# Patient Record
Sex: Female | Born: 1980 | Race: Black or African American | Hispanic: No | Marital: Single | Smoking: Current every day smoker
Health system: Southern US, Community
[De-identification: ages and names within clinical notes are randomized; demographics above are authoritative.]

## PROBLEM LIST (undated history)

## (undated) DIAGNOSIS — I2699 Other pulmonary embolism without acute cor pulmonale: Secondary | ICD-10-CM

## (undated) DIAGNOSIS — K5190 Ulcerative colitis, unspecified, without complications: Secondary | ICD-10-CM

## (undated) DIAGNOSIS — E119 Type 2 diabetes mellitus without complications: Secondary | ICD-10-CM

## (undated) HISTORY — DX: Type 2 diabetes mellitus without complications: E11.9

---

## 2011-02-04 DIAGNOSIS — I2699 Other pulmonary embolism without acute cor pulmonale: Secondary | ICD-10-CM | POA: Insufficient documentation

## 2013-11-08 ENCOUNTER — Emergency Department: Payer: Self-pay | Admitting: Emergency Medicine

## 2013-11-08 LAB — URINALYSIS, COMPLETE
BILIRUBIN, UR: NEGATIVE
GLUCOSE, UR: NEGATIVE mg/dL (ref 0–75)
KETONE: NEGATIVE
Nitrite: NEGATIVE
PH: 6 (ref 4.5–8.0)
PROTEIN: NEGATIVE
SPECIFIC GRAVITY: 1.023 (ref 1.003–1.030)
WBC UR: 6 /HPF (ref 0–5)

## 2014-05-22 ENCOUNTER — Emergency Department: Payer: Self-pay | Admitting: Emergency Medicine

## 2014-05-22 LAB — COMPREHENSIVE METABOLIC PANEL
ALBUMIN: 3.9 g/dL (ref 3.4–5.0)
ALK PHOS: 73 U/L
Anion Gap: 10 (ref 7–16)
BILIRUBIN TOTAL: 0.2 mg/dL (ref 0.2–1.0)
BUN: 8 mg/dL (ref 7–18)
CHLORIDE: 107 mmol/L (ref 98–107)
Calcium, Total: 9.1 mg/dL (ref 8.5–10.1)
Co2: 22 mmol/L (ref 21–32)
Creatinine: 0.84 mg/dL (ref 0.60–1.30)
Glucose: 92 mg/dL (ref 65–99)
Osmolality: 276 (ref 275–301)
POTASSIUM: 3.9 mmol/L (ref 3.5–5.1)
SGOT(AST): 25 U/L (ref 15–37)
SGPT (ALT): 33 U/L
Sodium: 139 mmol/L (ref 136–145)
Total Protein: 8.6 g/dL — ABNORMAL HIGH (ref 6.4–8.2)

## 2014-05-22 LAB — CBC WITH DIFFERENTIAL/PLATELET
Basophil #: 0.1 10*3/uL (ref 0.0–0.1)
Basophil %: 0.6 %
Eosinophil #: 0.2 10*3/uL (ref 0.0–0.7)
Eosinophil %: 1.7 %
HCT: 40.4 % (ref 35.0–47.0)
HGB: 13.8 g/dL (ref 12.0–16.0)
Lymphocyte #: 3.6 10*3/uL (ref 1.0–3.6)
Lymphocyte %: 32.6 %
MCH: 30.7 pg (ref 26.0–34.0)
MCHC: 34.1 g/dL (ref 32.0–36.0)
MCV: 90 fL (ref 80–100)
Monocyte #: 0.9 x10 3/mm (ref 0.2–0.9)
Monocyte %: 8.4 %
Neutrophil #: 6.3 10*3/uL (ref 1.4–6.5)
Neutrophil %: 56.7 %
Platelet: 325 10*3/uL (ref 150–440)
RBC: 4.49 10*6/uL (ref 3.80–5.20)
RDW: 13.6 % (ref 11.5–14.5)
WBC: 11.1 10*3/uL — ABNORMAL HIGH (ref 3.6–11.0)

## 2014-05-22 LAB — URINALYSIS, COMPLETE
Bacteria: NONE SEEN
Bilirubin,UR: NEGATIVE
Glucose,UR: NEGATIVE mg/dL (ref 0–75)
Ketone: NEGATIVE
NITRITE: NEGATIVE
PROTEIN: NEGATIVE
Ph: 5 (ref 4.5–8.0)
RBC,UR: 6 /HPF (ref 0–5)
Specific Gravity: 1.017 (ref 1.003–1.030)
WBC UR: 7 /HPF (ref 0–5)

## 2014-05-22 LAB — LIPASE, BLOOD: Lipase: 94 U/L (ref 73–393)

## 2014-09-09 ENCOUNTER — Emergency Department: Payer: Self-pay | Admitting: Emergency Medicine

## 2014-09-09 LAB — COMPREHENSIVE METABOLIC PANEL
ALK PHOS: 73 U/L
ANION GAP: 11 (ref 7–16)
Albumin: 3.7 g/dL (ref 3.4–5.0)
BILIRUBIN TOTAL: 0.4 mg/dL (ref 0.2–1.0)
BUN: 11 mg/dL (ref 7–18)
CHLORIDE: 108 mmol/L — AB (ref 98–107)
CO2: 20 mmol/L — AB (ref 21–32)
Calcium, Total: 8.5 mg/dL (ref 8.5–10.1)
Creatinine: 0.78 mg/dL (ref 0.60–1.30)
GLUCOSE: 93 mg/dL (ref 65–99)
OSMOLALITY: 277 (ref 275–301)
Potassium: 3.7 mmol/L (ref 3.5–5.1)
SGOT(AST): 17 U/L (ref 15–37)
SGPT (ALT): 21 U/L
Sodium: 139 mmol/L (ref 136–145)
Total Protein: 7.7 g/dL (ref 6.4–8.2)

## 2014-09-09 LAB — URINALYSIS, COMPLETE
BACTERIA: NONE SEEN
Bilirubin,UR: NEGATIVE
GLUCOSE, UR: NEGATIVE mg/dL (ref 0–75)
Ketone: NEGATIVE
Leukocyte Esterase: NEGATIVE
NITRITE: NEGATIVE
PH: 6 (ref 4.5–8.0)
Protein: 30
RBC,UR: 5 /HPF (ref 0–5)
Specific Gravity: 1.02 (ref 1.003–1.030)
Squamous Epithelial: 3
WBC UR: 6 /HPF (ref 0–5)

## 2014-09-09 LAB — CBC
HCT: 39.4 % (ref 35.0–47.0)
HGB: 13.1 g/dL (ref 12.0–16.0)
MCH: 29.8 pg (ref 26.0–34.0)
MCHC: 33.3 g/dL (ref 32.0–36.0)
MCV: 90 fL (ref 80–100)
Platelet: 282 10*3/uL (ref 150–440)
RBC: 4.4 10*6/uL (ref 3.80–5.20)
RDW: 13.3 % (ref 11.5–14.5)
WBC: 10.1 10*3/uL (ref 3.6–11.0)

## 2014-09-09 LAB — TROPONIN I: Troponin-I: <0.02 ng/mL

## 2014-10-27 ENCOUNTER — Emergency Department: Payer: Self-pay | Admitting: Emergency Medicine

## 2015-06-29 ENCOUNTER — Encounter: Payer: Self-pay | Admitting: *Deleted

## 2015-06-29 DIAGNOSIS — K219 Gastro-esophageal reflux disease without esophagitis: Secondary | ICD-10-CM | POA: Insufficient documentation

## 2015-06-29 DIAGNOSIS — Z720 Tobacco use: Secondary | ICD-10-CM | POA: Insufficient documentation

## 2015-06-29 DIAGNOSIS — Z3202 Encounter for pregnancy test, result negative: Secondary | ICD-10-CM | POA: Insufficient documentation

## 2015-06-29 DIAGNOSIS — K2970 Gastritis, unspecified, without bleeding: Secondary | ICD-10-CM | POA: Insufficient documentation

## 2015-06-29 LAB — CBC WITH DIFFERENTIAL/PLATELET
BASOS ABS: 0.1 10*3/uL (ref 0–0.1)
BASOS PCT: 1 %
EOS ABS: 0.2 10*3/uL (ref 0–0.7)
EOS PCT: 2 %
HCT: 37.5 % (ref 35.0–47.0)
Hemoglobin: 12.8 g/dL (ref 12.0–16.0)
Lymphocytes Relative: 33 %
Lymphs Abs: 3.3 10*3/uL (ref 1.0–3.6)
MCH: 29.7 pg (ref 26.0–34.0)
MCHC: 34 g/dL (ref 32.0–36.0)
MCV: 87.4 fL (ref 80.0–100.0)
MONO ABS: 0.7 10*3/uL (ref 0.2–0.9)
Monocytes Relative: 7 %
Neutro Abs: 5.5 10*3/uL (ref 1.4–6.5)
Neutrophils Relative %: 57 %
PLATELETS: 318 10*3/uL (ref 150–440)
RBC: 4.3 MIL/uL (ref 3.80–5.20)
RDW: 13.9 % (ref 11.5–14.5)
WBC: 9.8 10*3/uL (ref 3.6–11.0)

## 2015-06-29 LAB — URINALYSIS COMPLETE WITH MICROSCOPIC (ARMC ONLY)
BACTERIA UA: NONE SEEN
BILIRUBIN URINE: NEGATIVE
GLUCOSE, UA: NEGATIVE mg/dL
Ketones, ur: NEGATIVE mg/dL
NITRITE: NEGATIVE
Protein, ur: NEGATIVE mg/dL
SPECIFIC GRAVITY, URINE: 1.009 (ref 1.005–1.030)
pH: 6 (ref 5.0–8.0)

## 2015-06-29 LAB — COMPREHENSIVE METABOLIC PANEL
ALBUMIN: 3.9 g/dL (ref 3.5–5.0)
ALK PHOS: 68 U/L (ref 38–126)
ALT: 17 U/L (ref 14–54)
AST: 19 U/L (ref 15–41)
Anion gap: 4 — ABNORMAL LOW (ref 5–15)
BILIRUBIN TOTAL: 0.5 mg/dL (ref 0.3–1.2)
BUN: 6 mg/dL (ref 6–20)
CALCIUM: 8.8 mg/dL — AB (ref 8.9–10.3)
CO2: 28 mmol/L (ref 22–32)
CREATININE: 0.78 mg/dL (ref 0.44–1.00)
Chloride: 107 mmol/L (ref 101–111)
GFR calc Af Amer: >60 mL/min (ref >60)
GLUCOSE: 107 mg/dL — AB (ref 65–99)
POTASSIUM: 3.5 mmol/L (ref 3.5–5.1)
Sodium: 139 mmol/L (ref 135–145)
TOTAL PROTEIN: 7.4 g/dL (ref 6.5–8.1)

## 2015-06-29 LAB — POCT PREGNANCY, URINE: PREG TEST UR: NEGATIVE

## 2015-06-29 LAB — LIPASE, BLOOD: LIPASE: 21 U/L — AB (ref 22–51)

## 2015-06-29 NOTE — ED Notes (Signed)
Pt reports nausea every time she eats. LMP 1 week ago. Abdominal pain at top of abdomen.

## 2015-06-30 ENCOUNTER — Emergency Department
Admission: EM | Admit: 2015-06-30 | Discharge: 2015-06-30 | Disposition: A | Discharge status: Home / Self Care | Payer: Self-pay | Attending: Emergency Medicine | Admitting: Emergency Medicine

## 2015-06-30 DIAGNOSIS — K2970 Gastritis, unspecified, without bleeding: Secondary | ICD-10-CM

## 2015-06-30 DIAGNOSIS — K219 Gastro-esophageal reflux disease without esophagitis: Secondary | ICD-10-CM

## 2015-06-30 MED ORDER — SUCRALFATE 1 G PO TABS: 1.0000 g | ORAL_TABLET | Freq: Four times a day (QID) | ORAL | Status: AC

## 2015-06-30 MED ORDER — RANITIDINE HCL 150 MG PO CAPS: 150.0000 mg | ORAL_CAPSULE | Freq: Two times a day (BID) | ORAL | Status: AC

## 2015-06-30 MED ORDER — GI COCKTAIL ~~LOC~~
30.0000 mL | ORAL | Status: AC
Start: 2015-06-30 — End: 2015-06-30
  Administered 2015-06-30: 30 mL via ORAL
  Filled 2015-06-30: qty 30

## 2015-06-30 MED ORDER — FAMOTIDINE 20 MG PO TABS
40.0000 mg | ORAL_TABLET | Freq: Once | ORAL | Status: AC
  Administered 2015-06-30: 40 mg via ORAL
  Filled 2015-06-30: qty 2

## 2015-06-30 MED ORDER — ONDANSETRON 8 MG PO TBDP
8.0000 mg | ORAL_TABLET | Freq: Once | ORAL | Status: AC
  Administered 2015-06-30: 8 mg via ORAL
  Filled 2015-06-30: qty 1

## 2015-06-30 MED ORDER — ONDANSETRON 8 MG PO TBDP: 8.0000 mg | ORAL_TABLET | Freq: Three times a day (TID) | ORAL | Status: DC | PRN

## 2015-06-30 NOTE — ED Provider Notes (Signed)
[facility] Emergency Department [facility] [place] Note  ____________________________________________  Time seen: 1:45 AM  I have reviewed the triage vital signs and the nursing notes.   HISTORY  Chief Complaint Nausea and Abdominal Pain    HPI Samantha Mcclure is a 34 y.o. female who complains of upper abdominal burning pain as well as throat pain and sore throat for the past week. It's worse after she eats. Has not tried anything for this. Reports that she had this in the past about 6 or 9 months ago and was treated for gastritis which resolved it. No recent illness. Has nausea but no vomiting. No diarrhea constipation melena or [place] red blood.     History reviewed. No pertinent past medical history. Gastritis  There are no active problems to display for this patient.    Past Surgical History  Procedure Laterality Date  . Cesarean section       Current Outpatient Rx  Name  Route  Sig  Dispense  Refill  . ondansetron (ZOFRAN ODT) 8 MG disintegrating tablet   Oral   Take 1 tablet (8 mg total) by mouth every 8 (eight) hours as needed for nausea or vomiting.   20 tablet   0   . ranitidine (ZANTAC) 150 MG capsule   Oral   Take 1 capsule (150 mg total) by mouth 2 (two) times daily.   28 capsule   0   . sucralfate (CARAFATE) 1 G tablet   Oral   Take 1 tablet (1 g total) by mouth 4 (four) times daily.   120 tablet   1      Allergies Review of patient's allergies indicates no known allergies.   No family history on file.  Social History Social History  Substance Use Topics  . Smoking status: Current Every Day Smoker  . Smokeless tobacco: None  . Alcohol Use: No    Review of Systems  Constitutional:   No fever or chills. No weight changes Eyes:   No blurry vision or double vision.  ENT:   Positive sore throat. Cardiovascular:   No chest pain. Respiratory:   No dyspnea or cough. Gastrointestinal:   Upper abdominal pain with nausea but  no vomiting or diarrhea.  No BRBPR or melena. Genitourinary:   Negative for dysuria, urinary retention, bloody urine, or difficulty urinating. Musculoskeletal:   Negative for back pain. No joint swelling or pain. Skin:   Negative for rash. Neurological:   Negative for headaches, focal weakness or numbness. Psychiatric:  No anxiety or depression.   Endocrine:  No hot/cold intolerance, changes in energy, or sleep difficulty.  10-point ROS otherwise negative.  ____________________________________________   PHYSICAL EXAM:  VITAL SIGNS: ED Triage Vitals  Enc Vitals Group     BP 06/29/15 2255 138/83 mmHg     Pulse Rate 06/29/15 2255 89     Resp 06/29/15 2255 18     Temp 06/29/15 2255 98.1 F (36.7 C)     Temp Source 06/29/15 2255 Oral     SpO2 06/29/15 2255 99 %     Weight 06/29/15 2255 160 lb (72.576 kg)     Height 06/29/15 2255  (1.499 m)     Head Cir --      Peak Flow --      Pain Score 06/29/15 2252 3     Pain Loc --      Pain Edu? --      Excl. in GC? --  Constitutional:   Alert and oriented. Well appearing and in no distress. Eyes:   No scleral icterus. No conjunctival pallor. PERRL. EOMI ENT   Head:   Normocephalic and atraumatic.   Nose:   No congestion/rhinnorhea. No septal hematoma   Mouth/Throat:   MMM, no pharyngeal erythema. No peritonsillar mass. No uvula shift.   Neck:   No stridor. No SubQ emphysema. No meningismus. Hematological/Lymphatic/Immunilogical:   No cervical lymphadenopathy. Cardiovascular:   RRR. Normal and symmetric distal pulses are present in all extremities. No murmurs, rubs, or gallops. Respiratory:   Normal respiratory effort without tachypnea nor retractions. Breath sounds are clear and equal bilaterally. No wheezes/rales/rhonchi. Gastrointestinal:   Soft with left upper quadrant tenderness. No distention. There is no CVA tenderness.  No rebound, rigidity, or guarding. Genitourinary:   deferred Musculoskeletal:    Nontender with normal range of motion in all extremities. No joint effusions.  No lower extremity tenderness.  No edema. Neurologic:   Normal speech and language.  CN 2-10 normal. Motor grossly intact. No pronator drift.  Normal gait. No gross focal neurologic deficits are appreciated.  Skin:    Skin is warm, dry and intact. No rash noted.  No petechiae, purpura, or bullae. Psychiatric:   Mood and affect are normal. Speech and behavior are normal. Patient exhibits appropriate insight and judgment.  ____________________________________________    LABS (pertinent positives/negatives) (all labs ordered are listed, but only abnormal results are displayed) Labs Reviewed  COMPREHENSIVE METABOLIC PANEL - Abnormal; Notable for the following:    Glucose, Bld 107 (*)    Calcium 8.8 (*)    Anion gap 4 (*)    All other components within normal limits  LIPASE, BLOOD - Abnormal; Notable for the following:    Lipase 21 (*)    All other components within normal limits  URINALYSIS COMPLETEWITH MICROSCOPIC ([facility] ONLY) - Abnormal; Notable for the following:    Color, Urine YELLOW (*)    APPearance CLEAR (*)    Hgb urine dipstick 1+ (*)    Leukocytes, UA 1+ (*)    Squamous Epithelial / LPF 6-30 (*)    All other components within normal limits  CBC WITH DIFFERENTIAL/PLATELET  POC URINE PREG, ED  POCT PREGNANCY, URINE   ____________________________________________   EKG    ____________________________________________    RADIOLOGY    ____________________________________________   PROCEDURES   ____________________________________________   INITIAL IMPRESSION / ASSESSMENT AND PLAN / ED COURSE  Pertinent labs & imaging results that were available during my care of the patient were reviewed by me and considered in my medical decision making (see chart for details).  Consistent with gastritis. Low suspicion for ACS PE TAD carditis mediastinitis pneumonia or sepsis. Was suspicion  for AAA pancreatitis or biliary pathology. No obstruction or perforation. We'll give her Zofran and GI cocktail and Pepcid and have her follow up with GI.     ____________________________________________   FINAL CLINICAL IMPRESSION(S) / ED DIAGNOSES  Final diagnoses:  Gastritis  Gastroesophageal reflux disease without esophagitis      Phillip SSharman Cheek9/23/16 [phone]

## 2015-06-30 NOTE — ED Notes (Signed)
Pt reports pain in epigastric area that worsens after eating x 1 week. Pt reports having a decreased number of bowel movements over the past month (since last period). Pt had diarrhea yesterday but reports "swelling" after eating. Pt denies pain with bowel movements and denies vomiting.

## 2015-06-30 NOTE — Discharge Instructions (Signed)
Gastritis, Adult Gastritis is soreness and swelling (inflammation) of the lining of the stomach. Gastritis can develop as a sudden onset (acute) or long-term (chronic) condition. If gastritis is not treated, it can lead to stomach bleeding and ulcers. CAUSES  Gastritis occurs when the stomach lining is weak or damaged. Digestive juices from the stomach then inflame the weakened stomach lining. The stomach lining may be weak or damaged due to viral or bacterial infections. One common bacterial infection is the Helicobacter pylori infection. Gastritis can also result from excessive alcohol consumption, taking certain medicines, or having too much acid in the stomach.  SYMPTOMS  In some cases, there are no symptoms. When symptoms are present, they may include:  Pain or a burning sensation in the upper abdomen.  Nausea.  Vomiting.  An uncomfortable feeling of fullness after eating. DIAGNOSIS  Your caregiver may suspect you have gastritis based on your symptoms and a physical exam. To determine the cause of your gastritis, your caregiver may perform the following:  Blood or stool tests to check for the H pylori bacterium.  Gastroscopy. A thin, flexible tube (endoscope) is passed down the esophagus and into the stomach. The endoscope has a light and camera on the end. Your caregiver uses the endoscope to view the inside of the stomach.  Taking a tissue sample (biopsy) from the stomach to examine under a microscope. TREATMENT  Depending on the cause of your gastritis, medicines may be prescribed. If you have a bacterial infection, such as an H pylori infection, antibiotics may be given. If your gastritis is caused by too much acid in the stomach, H2 blockers or antacids may be given. Your caregiver may recommend that you stop taking aspirin, ibuprofen, or other nonsteroidal anti-inflammatory drugs (NSAIDs). HOME CARE INSTRUCTIONS  Only take over-the-counter or prescription medicines as directed by  your caregiver.  If you were given antibiotic medicines, take them as directed. Finish them even if you start to feel better.  Drink enough fluids to keep your urine clear or pale yellow.  Avoid foods and drinks that make your symptoms worse, such as:  Caffeine or alcoholic drinks.  Chocolate.  Peppermint or mint flavorings.  Garlic and onions.  Spicy foods.  Citrus fruits, such as oranges, lemons, or limes.  Tomato-based foods such as sauce, chili, salsa, and pizza.  Fried and fatty foods.  Eat small, frequent meals instead of large meals. SEEK IMMEDIATE MEDICAL CARE IF:   You have black or dark red stools.  You vomit blood or material that looks like coffee grounds.  You are unable to keep fluids down.  Your abdominal pain gets worse.  You have a fever.  You do not feel better after 1 week.  You have any other questions or concerns. MAKE SURE YOU:  Understand these instructions.  Will watch your condition.  Will get help right away if you are not doing well or get worse. Document Released: 09/17/2001 Document Revised: 03/24/2012 Document Reviewed: 11/06/2011 [facility] Patient Information 2015 [place], [place]. This information is not intended to replace advice given to you by your health care provider. Make sure you discuss any questions you have with your health care provider.  Food Choices for Gastroesophageal Reflux Disease When you have gastroesophageal reflux disease (GERD), the foods you eat and your eating habits are very important. Choosing the right foods can help ease the discomfort of GERD. WHAT GENERAL GUIDELINES DO I NEED TO FOLLOW?  Choose fruits, vegetables, whole grains, low-fat dairy products, and low-fat  meat, fish, and poultry.  Limit fats such as oils, salad dressings, butter, nuts, and avocado.  Keep a food diary to identify foods that cause symptoms.  Avoid foods that cause reflux. These may be different for different people.  Eat  frequent small meals instead of three large meals each day.  Eat your meals slowly, in a relaxed setting.  Limit fried foods.  Cook foods using methods other than frying.  Avoid drinking alcohol.  Avoid drinking large amounts of liquids with your meals.  Avoid bending over or lying down until 2-3 hours after eating. WHAT FOODS ARE NOT RECOMMENDED? The following are some foods and drinks that may worsen your symptoms: Vegetables Tomatoes. Tomato juice. Tomato and spaghetti sauce. Chili peppers. Onion and garlic. Horseradish. Fruits Oranges, grapefruit, and lemon (fruit and juice). Meats High-fat meats, fish, and poultry. This includes hot dogs, ribs, ham, sausage, salami, and bacon. Dairy Whole milk and chocolate milk. Sour cream. Cream. Butter. Ice cream. Cream cheese.  Beverages Coffee and tea, with or without caffeine. Carbonated beverages or energy drinks. Condiments Hot sauce. Barbecue sauce.  Sweets/Desserts Chocolate and cocoa. Donuts. Peppermint and spearmint. Fats and Oils High-fat foods, including [place] fries and potato chips. Other Vinegar. Strong spices, such as black pepper, white pepper, red pepper, cayenne, curry powder, cloves, ginger, and chili powder. The items listed above may not be a complete list of foods and beverages to avoid. Contact your dietitian for more information. Document Released: 09/23/2005 Document Revised: 09/28/2013 Document Reviewed: 07/28/2013 [facility] Patient Information 2015 [place], [place]. This information is not intended to replace advice given to you by your health care provider. Make sure you discuss any questions you have with your health care provider.  Gastroesophageal Reflux Disease, Adult Gastroesophageal reflux disease (GERD) happens when acid from your stomach goes into your food pipe (esophagus). The acid can cause a burning feeling in your chest. Over time, the acid can make small holes (ulcers) in your food pipe.  HOME  CARE  Ask your doctor for advice about:  Losing weight.  Quitting smoking.  Alcohol use.  Avoid foods and drinks that make your problems worse. You may want to avoid:  Caffeine and alcohol.  Chocolate.  Mints.  Garlic and onions.  Spicy foods.  Citrus fruits, such as oranges, lemons, or limes.  Foods that contain tomato, such as sauce, chili, salsa, and pizza.  Fried and fatty foods.  Avoid lying down for 3 hours before you go to bed or before you take a nap.  Eat small meals often, instead of large meals.  Wear loose-fitting clothing. Do not wear anything tight around your waist.  Raise (elevate) the head of your bed 6 to 8 inches with wood blocks. Using extra pillows does not help.  Only take medicines as told by your doctor.  Do not take aspirin or ibuprofen. GET HELP RIGHT AWAY IF:   You have pain in your arms, neck, jaw, teeth, or back.  Your pain gets worse or changes.  You feel sick to your stomach (nauseous), throw up (vomit), or sweat (diaphoresis).  You feel short of breath, or you pass out (faint).  Your throw up is green, yellow, black, or looks like coffee grounds or blood.  Your poop (stool) is red, bloody, or black. MAKE SURE YOU:   Understand these instructions.  Will watch your condition.  Will get help right away if you are not doing well or get worse. Document Released: 03/11/2008 Document Revised: 12/16/2011 Document Reviewed:  04/12/2011 [employer] Patient Information 2015 [place], [place]. This information is not intended to replace advice given to you by your health care provider. Make sure you discuss any questions you have with your health care provider.

## 2015-08-10 ENCOUNTER — Encounter: Payer: Self-pay | Admitting: Emergency Medicine

## 2015-08-10 ENCOUNTER — Emergency Department
Admission: EM | Admit: 2015-08-10 | Discharge: 2015-08-10 | Disposition: A | Discharge status: Home / Self Care | Payer: Self-pay | Attending: Student | Admitting: Student

## 2015-08-10 DIAGNOSIS — M65841 Other synovitis and tenosynovitis, right hand: Secondary | ICD-10-CM | POA: Insufficient documentation

## 2015-08-10 DIAGNOSIS — Z720 Tobacco use: Secondary | ICD-10-CM | POA: Insufficient documentation

## 2015-08-10 DIAGNOSIS — M659 Synovitis and tenosynovitis, unspecified: Secondary | ICD-10-CM

## 2015-08-10 DIAGNOSIS — Z79899 Other long term (current) drug therapy: Secondary | ICD-10-CM | POA: Insufficient documentation

## 2015-08-10 HISTORY — DX: Ulcerative colitis, unspecified, without complications: K51.90

## 2015-08-10 MED ORDER — TRAMADOL HCL 50 MG PO TABS: ORAL_TABLET | ORAL | Status: AC

## 2015-08-10 NOTE — ED Notes (Signed)
Patient reports right hand pain that started Tuesday.  She thinks she has tendonitis or carpel tunnel.  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

## 2015-08-10 NOTE — Discharge Instructions (Signed)
Continue wearing the brace for support. Take Tylenol during the day as needed for pain. He may take tramadol at night or if at home. The aware that you should not be driving or operating machinery while taking this medication. Follow-up with Dr. Lutricia HorsfallHooton if any continued problems.

## 2015-08-10 NOTE — ED Provider Notes (Signed)
[facility] Emergency Department Provider Note  ____________________________________________  Time seen: Approximately 7:30 AM  I have reviewed the triage vital signs and the nursing notes.   HISTORY  Chief Complaint Hand Pain   HPI Samantha Mcclure is a 34 y.o. female chief complaint of right hand pain 2 days. Patient denies any recent injury. 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 Patient states she is unaware of what she can take for pain since she has a ulcer. Patient has a wrist splint that she has been wearing for support. Patient rates her pain as 9 out of 10.   Past Medical History  Diagnosis Date  . Ulcerative colitis (HCC)     There are no active problems to display for this patient.   Past Surgical History  Procedure Laterality Date  . Cesarean section      Current Outpatient Rx  Name  Route  Sig  Dispense  Refill  . ondansetron (ZOFRAN ODT) 8 MG disintegrating tablet   Oral   Take 1 tablet (8 mg total) by mouth every 8 (eight) hours as needed for nausea or vomiting.   20 tablet   0   . ranitidine (ZANTAC) 150 MG capsule   Oral   Take 1 capsule (150 mg total) by mouth 2 (two) times daily.   28 capsule   0   . sucralfate (CARAFATE) 1 G tablet   Oral   Take 1 tablet (1 g total) by mouth 4 (four) times daily.   120 tablet   1   . traMADol (ULTRAM) 50 MG tablet      Take one tablet q 6 hours if not driving or operating machines   20 tablet   0     Allergies Nsaids  History reviewed. No pertinent family history.  Social History Social History  Substance Use Topics  . Smoking status: Current Every Day Smoker  . Smokeless tobacco: None  . Alcohol Use: No    Review of Systems Constitutional: No fever/chills ENT: No sore throat. Cardiovascular: Denies chest pain. Respiratory: Denies shortness of breath. Gastrointestinal:  No nausea, no  vomiting.  Musculoskeletal: Negative for back pain. Skin: Negative for rash. Neurological: Negative for headaches, focal weakness or numbness.  10-point ROS otherwise negative.  ____________________________________________   PHYSICAL EXAM:  VITAL SIGNS: ED Triage Vitals  Enc Vitals Group     BP 08/10/15 0720 135/84 mmHg     Pulse Rate 08/10/15 0720 96     Resp 08/10/15 0720 16     Temp 08/10/15 0720 98.1 F (36.7 C)     Temp Source 08/10/15 0720 Oral     SpO2 08/10/15 0720 96 %     Weight 08/10/15 0720 160 lb (72.576 kg)     Height 08/10/15 0720 4\' 11"  (1.499 m)     Head Cir --      Peak Flow --      Pain Score 08/10/15 0718 9     Pain Loc --      Pain Edu? --      Excl. in GC? --     Constitutional: Alert and oriented. Well appearing and in no acute distress. Eyes: Conjunctivae are normal. PERRL. EOMI. Head: Atraumatic. Nose: No congestion/rhinnorhea. Mouth/Throat: Mucous membranes are moist.  Oropharynx non-erythematous. Neck: No stridor.   Cardiovascular: Normal rate, regular rhythm. Grossly normal heart sounds.  Good peripheral circulation. Respiratory:  Normal respiratory effort.  No retractions. Lungs CTAB. Gastrointestinal: Soft and nontender. No distention.  Musculoskeletal: Moves upper and lower extremities without any difficulty. No lower extremity tenderness nor edema. Right hand dorsum there is tenderness on palpation of the metacarpal area without any signs of deformity. There is some soft tissue tenderness present. Range of motion and distal joints is without restriction. Motor sensory function intact. Neurologic:  Normal speech and language. No gross focal neurologic deficits are appreciated. No gait instability. Skin:  Skin is warm, dry and intact. No rash noted. Psychiatric: Mood and affect are normal. Speech and behavior are normal.  ____________________________________________   LABS (all labs ordered are listed, but only abnormal results are  displayed)  Labs Reviewed - No data to display  PROCEDURES  Procedure(s) performed: None  Critical Care performed: No  ____________________________________________   INITIAL IMPRESSION / ASSESSMENT AND PLAN / ED COURSE  Pertinent labs & imaging results that were available during my care of the patient were reviewed by me and considered in my medical decision making (see chart for details).  Patient is continue wearing a wrist brace for support. She is told to take Tylenol during the day when she is working. She is given a prescription for tramadol as needed for pain at night or when she is a home. She was advised that this could cause drowsiness and should not be driving or operating machinery. ____________________________________________   FINAL CLINICAL IMPRESSION(S) / ED DIAGNOSES  Final diagnoses:  Tenosynovitis of right hand      Tommi Rumps, PA-C 08/10/15 1249  Gayla Doss, MD 08/11/15 0000

## 2015-08-10 NOTE — ED Notes (Signed)
Developed right hand pain with some swelling since Tuesday  denies any specific injury positive pulses  Good circulation.

## 2016-08-07 DIAGNOSIS — Z86711 Personal history of pulmonary embolism: Secondary | ICD-10-CM | POA: Insufficient documentation

## 2017-01-24 ENCOUNTER — Encounter: Payer: Self-pay | Admitting: Emergency Medicine

## 2017-01-24 ENCOUNTER — Emergency Department: Payer: 59

## 2017-01-24 ENCOUNTER — Emergency Department
Admission: EM | Admit: 2017-01-24 | Discharge: 2017-01-24 | Disposition: A | Discharge status: Home / Self Care | Payer: 59 | Attending: Emergency Medicine | Admitting: Emergency Medicine

## 2017-01-24 DIAGNOSIS — M549 Dorsalgia, unspecified: Secondary | ICD-10-CM | POA: Diagnosis present

## 2017-01-24 DIAGNOSIS — F17200 Nicotine dependence, unspecified, uncomplicated: Secondary | ICD-10-CM | POA: Insufficient documentation

## 2017-01-24 DIAGNOSIS — M67431 Ganglion, right wrist: Secondary | ICD-10-CM | POA: Diagnosis not present

## 2017-01-24 DIAGNOSIS — M79662 Pain in left lower leg: Secondary | ICD-10-CM | POA: Insufficient documentation

## 2017-01-24 DIAGNOSIS — M25511 Pain in right shoulder: Secondary | ICD-10-CM | POA: Diagnosis not present

## 2017-01-24 DIAGNOSIS — M7918 Myalgia, other site: Secondary | ICD-10-CM

## 2017-01-24 HISTORY — DX: Other pulmonary embolism without acute cor pulmonale: I26.99

## 2017-01-24 LAB — CBC
HCT: 35.8 % (ref 35.0–47.0)
Hemoglobin: 12.4 g/dL (ref 12.0–16.0)
MCH: 29.4 pg (ref 26.0–34.0)
MCHC: 34.7 g/dL (ref 32.0–36.0)
MCV: 84.7 fL (ref 80.0–100.0)
PLATELETS: 341 10*3/uL (ref 150–440)
RBC: 4.23 MIL/uL (ref 3.80–5.20)
RDW: 13.9 % (ref 11.5–14.5)
WBC: 8.9 10*3/uL (ref 3.6–11.0)

## 2017-01-24 LAB — HEPATIC FUNCTION PANEL
ALK PHOS: 58 U/L (ref 38–126)
ALT: 20 U/L (ref 14–54)
AST: 20 U/L (ref 15–41)
Albumin: 4.1 g/dL (ref 3.5–5.0)
BILIRUBIN TOTAL: 0.3 mg/dL (ref 0.3–1.2)
Total Protein: 7.9 g/dL (ref 6.5–8.1)

## 2017-01-24 LAB — FIBRIN DERIVATIVES D-DIMER (ARMC ONLY): FIBRIN DERIVATIVES D-DIMER (ARMC): 247.4 (ref 0.00–499.00)

## 2017-01-24 LAB — BASIC METABOLIC PANEL
Anion gap: 6 (ref 5–15)
BUN: 10 mg/dL (ref 6–20)
CO2: 25 mmol/L (ref 22–32)
CREATININE: 0.86 mg/dL (ref 0.44–1.00)
Calcium: 8.9 mg/dL (ref 8.9–10.3)
Chloride: 106 mmol/L (ref 101–111)
GFR calc Af Amer: >60 mL/min (ref >60)
GFR calc non Af Amer: >60 mL/min (ref >60)
Glucose, Bld: 91 mg/dL (ref 65–99)
Potassium: 3.6 mmol/L (ref 3.5–5.1)
Sodium: 137 mmol/L (ref 135–145)

## 2017-01-24 LAB — LIPASE, BLOOD: LIPASE: 22 U/L (ref 11–51)

## 2017-01-24 LAB — TROPONIN I: Troponin I: <0.03 ng/mL (ref <0.03)

## 2017-01-24 NOTE — Discharge Instructions (Signed)
As we discussed, your workup today was reassuring.  Though we do not know exactly what is causing your symptoms, it appears that you have no emergent medical condition at this time and that you are safe to go home and follow up as recommended in this paperwork.  Based on your workup, there is almost a zero percent chance that you are suffering from a blood clot in your legs or lungs and you do not seem to have an emergent medical condition at this time.  We encourage you to use over-the-counter ibuprofen and/or Tylenol.  Consider taking a daily low-dose (81 mg) aspirin as well.  Please return immediately to the Emergency Department if you develop any new or worsening symptoms that concern you.

## 2017-01-24 NOTE — ED Notes (Signed)
Patient reports she has been taking a lot of long car trips lately. Pt reports previous history of PE. Patient reports her back pain to be the same now as it was with previous PE

## 2017-01-24 NOTE — ED Notes (Signed)
Reviewed d/c instructions, follow-up care with patient. Pt verbalized understanding.  

## 2017-01-24 NOTE — ED Triage Notes (Signed)
Pt ambulatory to triage in NAD, reports pain to right back/shoulder x 1 week, states similar pain in past and was dx with PE.  Pt also c/o left lower leg x 2 days shooting pain, denies hx of DVT.  Pt also c/o knot to right hand x 1 month, painful to touch.  Respirations equal and unlabored, skin warm and dry.

## 2017-01-24 NOTE — ED Provider Notes (Signed)
[facility] Emergency Department Provider Note  ____________________________________________   First MD Initiated Contact with Patient 01/24/17 0300     (approximate)  I have reviewed the triage vital signs and the nursing notes.   HISTORY  Chief Complaint Back Pain (right shoulder/back) and Leg Pain (left lower leg)    HPI Jaydon Soroka is a 36 y.o. female who reports a past medical history that includes a pulmonary embolism in 2012 without any specific cause and for which she is no longer on anticoagulation who presents for evaluation of about 1 week of pain in her right shoulder as about 2 days of pain in her left calf.  Additionally she has had a knot on the back of her right hand for about a month it is painful.  She is concerned because the pain in her back is similar to the pain she had 6 years ago when she had a pulmonary embolism.  She denies shortness of breath, chest pain, nausea, vomiting, fever/chills, abdominal pain.  She has had no swelling of her legs.  She has been making multi hour car trips recently but not more than in the past.  She is not on any exogenous estrogen.  She is not had any recent surgeries and has no history of cancer.  She describes all of the pain as aching, worse with movement, nothing in particular makes it better nor worse.  She does state that she works long hours on her feet and using her upper body and it does feel musculoskeletal but she is becoming concerned because of her history.   Past Medical History:  Diagnosis Date  . Pulmonary embolism (HCC)   . Ulcerative colitis (HCC)     There are no active problems to display for this patient.   Past Surgical History:  Procedure Laterality Date  . CESAREAN SECTION      Prior to Admission medications   Medication Sig Start Date End Date Taking? Authorizing Provider  ondansetron (ZOFRAN ODT) 8 MG disintegrating tablet Take 1 tablet (8 mg total) by mouth every 8 (eight)  hours as needed for nausea or vomiting. 06/30/15   Sharman Cheek, MD  ranitidine (ZANTAC) 150 MG capsule Take 1 capsule (150 mg total) by mouth 2 (two) times daily. 06/30/15   Sharman Cheek, MD  sucralfate (CARAFATE) 1 G tablet Take 1 tablet (1 g total) by mouth 4 (four) times daily. 06/30/15   Sharman Cheek, MD  traMADol Janean Sark) 50 MG tablet Take one tablet q 6 hours if not driving or operating machines 08/10/15   Tommi Rumps, PA-C    Allergies Nsaids  History reviewed. No pertinent family history.  Social History Social History  Substance Use Topics  . Smoking status: Current Every Day Smoker  . Smokeless tobacco: Never Used  . Alcohol use No    Review of Systems Constitutional: No fever/chills Eyes: No visual changes. ENT: No sore throat. Cardiovascular: Denies chest pain. Respiratory: Denies shortness of breath. Gastrointestinal: No abdominal pain.  No nausea, no vomiting.  No diarrhea.  No constipation. Genitourinary: Negative for dysuria. Musculoskeletal: Pain In the right posterior shoulder and in her left calf.  He also has some pain and a "knot" on the back of her right wrist Skin: Negative for rash. Neurological: Negative for headaches, focal weakness or numbness.  10-point ROS otherwise negative.  ____________________________________________   PHYSICAL EXAM:  VITAL SIGNS: ED Triage Vitals  Enc Vitals Group     BP 01/24/17 0135 Marland Kitchen)  136/91     Pulse Rate 01/24/17 0135 80     Resp 01/24/17 0135 16     Temp 01/24/17 0135 98.4 F (36.9 C)     Temp Source 01/24/17 0135 Oral     SpO2 01/24/17 0135 100 %     Weight 01/24/17 0135 159 lb (72.1 kg)     Height 01/24/17 0135  (1.499 m)     Head Circumference --      Peak Flow --      Pain Score 01/24/17 0134 10     Pain Loc --      Pain Edu? --      Excl. in GC? --     Constitutional: Alert and oriented. Well appearing and in no acute distress. Eyes: Conjunctivae are normal. PERRL.  EOMI. Head: Atraumatic. Nose: No congestion/rhinnorhea. Mouth/Throat: Mucous membranes are moist. Neck: No stridor.  No meningeal signs.   Cardiovascular: Normal rate, regular rhythm. Good peripheral circulation. Grossly normal heart sounds. Respiratory: Normal respiratory effort.  No retractions. Lungs CTAB. Gastrointestinal: Soft and nontender. No distention.  Musculoskeletal: Highly reproducible posterior right shoulder tenderness to palpation and with range of motion of the right shoulder.  Mild reproducible tenderness to palpation of the left calf.  There is no edema in her lower extremities, no swelling, the compartments are soft, and there are no cordlike structures palpable in her left lower extremity.  She has no pain or tenderness in the popliteal fossa.  Additionally she has a palpable firm nodule on the dorsal aspect of her right wrist that is slightly tender to palpation with no surrounding erythema or fluctuance which is most consistent with a ganglion cyst.  She is neurovascularly intact distally. Neurologic:  Normal speech and language. No gross focal neurologic deficits are appreciated.  Skin:  Skin is warm, dry and intact. No rash noted. Psychiatric: Mood and affect are normal. Speech and behavior are normal.  ____________________________________________   LABS (all labs ordered are listed, but only abnormal results are displayed)  Labs Reviewed  HEPATIC FUNCTION PANEL - Abnormal; Notable for the following:       Result Value   Bilirubin, Direct <0.1 (*)    All other components within normal limits  BASIC METABOLIC PANEL  CBC  TROPONIN I  LIPASE, BLOOD  FIBRIN DERIVATIVES D-DIMER ([facility] ONLY)   ____________________________________________  EKG  ED ECG REPORT I, Oral Remache, the attending physician, personally viewed and interpreted this ECG.  Date: 01/24/2017 EKG Time: 1:40 AM Rate: 69 Rhythm: normal sinus rhythm QRS Axis: normal Intervals: normal ST/T  Wave abnormalities: normal Conduction Disturbances: none Narrative Interpretation: unremarkable  ____________________________________________  RADIOLOGY   Dg Chest 2 View  Result Date: 01/24/2017 CLINICAL DATA:  Pain in the right chest, back, and shoulder for 1 week. Previous history of pulmonary embolus. EXAM: CHEST  2 VIEW COMPARISON:  09/09/2014 FINDINGS: The heart size and mediastinal contours are within normal limits. Both lungs are clear. The visualized skeletal structures are unremarkable. IMPRESSION: No active cardiopulmonary disease. Electronically Signed   By: Burman Nieves M.D.   On: 01/24/2017 02:11    ____________________________________________   PROCEDURES  Critical Care performed: No   Procedure(s) performed:   Procedures   ____________________________________________   INITIAL IMPRESSION / ASSESSMENT AND PLAN / ED COURSE  Pertinent labs & imaging results that were available during my care of the patient were reviewed by me and considered in my medical decision making (see chart for details).  The patient has had  no chest pain nor shortness of breath.  She has highly reproducible pain and tenderness particularly in her right posterior shoulder is consistent with musculoskeletal pain.  Her Wells Score for DVT is zero, and she has a Wells Score for PE of 1.5 due to her prior PE.  This places her in a low risk category for both conditions.  As a result, I felt that ruling out with a d-dimer would be appropriate, and in fact her d-dimer is within normal limits.  I discussed all this with her and explained the risks and benefits of testing based on her reassuring d-dimer with a strong negative predictive value.  She seems reassured by the results which I explained to the best of my ability and I explained to her that I do not recommend additional testing at this time.  Additionally the nodule on the back of her wrist is most consistent with a ganglion cyst.  She  has no neurovascular compromise and no evidence of an infectious process.  I recommended follow-up with a hand orthopedic specialist to discuss further surgical management for conservative management is most appropriate.  She understands the plan for outpatient follow-up and I gave her my usual and customary return precautions.     ____________________________________________  FINAL CLINICAL IMPRESSION(S) / ED DIAGNOSES  Final diagnoses:  Acute pain of right shoulder  Pain of left calf  Musculoskeletal pain  Ganglion cyst of dorsum of right wrist     MEDICATIONS GIVEN DURING THIS VISIT:  Medications - No data to display   NEW OUTPATIENT MEDICATIONS STARTED DURING THIS VISIT:  New Prescriptions   No medications on file    Modified Medications   No medications on file    Discontinued Medications   No medications on file     Note:  This document was prepared using Dragon voice recognition software and may include unintentional dictation errors.    Loleta Rose, MD 01/24/17 [phone]

## 2017-01-24 NOTE — ED Notes (Signed)
Patient c/o back pain X 3 weeks. Pt c/o lower left leg pain X 2 days. Pt c/o pain at right wrist, at area of bump X 3 days.

## 2017-02-07 DIAGNOSIS — M67431 Ganglion, right wrist: Secondary | ICD-10-CM | POA: Insufficient documentation

## 2017-03-26 ENCOUNTER — Emergency Department
Admission: EM | Admit: 2017-03-26 | Discharge: 2017-03-26 | Disposition: A | Discharge status: Home / Self Care | Payer: 59 | Attending: Emergency Medicine | Admitting: Emergency Medicine

## 2017-03-26 DIAGNOSIS — R21 Rash and other nonspecific skin eruption: Secondary | ICD-10-CM | POA: Diagnosis present

## 2017-03-26 DIAGNOSIS — L743 Miliaria, unspecified: Secondary | ICD-10-CM

## 2017-03-26 DIAGNOSIS — F17200 Nicotine dependence, unspecified, uncomplicated: Secondary | ICD-10-CM | POA: Insufficient documentation

## 2017-03-26 DIAGNOSIS — Z79899 Other long term (current) drug therapy: Secondary | ICD-10-CM | POA: Diagnosis not present

## 2017-03-26 MED ORDER — HYDROXYZINE HCL 50 MG PO TABS
50.0000 mg | ORAL_TABLET | Freq: Once | ORAL | Status: AC
  Administered 2017-03-26: 50 mg via ORAL
  Filled 2017-03-26: qty 1

## 2017-03-26 MED ORDER — HYDROCORTISONE VALERATE 0.2 % EX OINT: TOPICAL_OINTMENT | CUTANEOUS | 1 refills | Status: AC

## 2017-03-26 MED ORDER — HYDROXYZINE HCL 50 MG PO TABS
50.0000 mg | ORAL_TABLET | Freq: Three times a day (TID) | ORAL | 0 refills | Status: AC | PRN
Start: 2017-03-26 — End: ?

## 2017-03-26 NOTE — ED Triage Notes (Addendum)
Pt states that she has had an itchy rash to her bilateral arms and behind her bilateral knees since Saturday.  Pt states that she started a new job on Friday and that the symptoms started the day after.  Pt states that she has be using OTC hydrocortisone cream with no relief.  Pt denies any difficulty breathing or any scratchy/itchiness to her throat.  Pt is A&Ox4, in NAD, ambulatory to triage.

## 2017-03-26 NOTE — ED Notes (Signed)
Pt to ed with c/o rash to bilat arms and bilat knees.  Pt states started on Saturday, +itching, dry patches of skin noted to bilat upper arms and behind both knees.

## 2017-03-26 NOTE — ED Provider Notes (Signed)
[facility] Emergency Department Provider Note   ____________________________________________   First MD Initiated Contact with Patient 03/26/17 [phone]     (approximate)  I have reviewed the triage vital signs and the nursing notes.   HISTORY  Chief Complaint Rash    HPI Samantha Mcclure is a 36 y.o. female patient complaining of itchy rash bilateral arms and popliteal area. Patient stated rash started 4 days ago. Patient is a seasonal onset of a heat rash. Patient state no resolves over-the-counter hydrocortisone cream but this time it has not helped. Patient denies pain percent her discomfort is "itchy".  Past Medical History:  Diagnosis Date  . Pulmonary embolism (HCC)   . Ulcerative colitis (HCC)     There are no active problems to display for this patient.   Past Surgical History:  Procedure Laterality Date  . CESAREAN SECTION      Prior to Admission medications   Medication Sig Start Date End Date Taking? Authorizing Provider  hydrocortisone valerate ointment (WESTCORT) 0.2 % Apply to affected area daily 03/26/17 03/26/18  Joni ReiningSmith, Aayush Gelpi K, PA-C  hydrOXYzine (ATARAX/VISTARIL) 50 MG tablet Take 1 tablet (50 mg total) by mouth 3 (three) times daily as needed. 03/26/17   Joni ReiningSmith, Trayson Stitely K, PA-C  ondansetron (ZOFRAN ODT) 8 MG disintegrating tablet Take 1 tablet (8 mg total) by mouth every 8 (eight) hours as needed for nausea or vomiting. 06/30/15   Sharman CheekStafford, Phillip, MD  ranitidine (ZANTAC) 150 MG capsule Take 1 capsule (150 mg total) by mouth 2 (two) times daily. 06/30/15   Sharman CheekStafford, Phillip, MD  sucralfate (CARAFATE) 1 G tablet Take 1 tablet (1 g total) by mouth 4 (four) times daily. 06/30/15   Sharman CheekStafford, Phillip, MD  traMADol Janean Sark(ULTRAM) 50 MG tablet Take one tablet q 6 hours if not driving or operating machines 08/10/15   Tommi RumpsSummers, Rhonda L, PA-C    Allergies Nsaids  No family history on file.  Social History Social History  Substance Use Topics  .  Smoking status: Current Every Day Smoker  . Smokeless tobacco: Never Used  . Alcohol use No    Review of Systems  Constitutional: No fever/chills Eyes: No visual changes. ENT: No sore throat. Cardiovascular: Denies chest pain. Respiratory: Denies shortness of breath. Gastrointestinal: No abdominal pain.  No nausea, no vomiting.  No diarrhea.  No constipation. Genitourinary: Negative for dysuria. Musculoskeletal: Negative for back pain. Skin: Positive for rash. Neurological: Negative for headaches, focal weakness or numbness. Allergic/Immunilogical: NSAIDs ____________________________________________   PHYSICAL EXAM:  VITAL SIGNS: ED Triage Vitals [03/26/17 0529]  Enc Vitals Group     BP 139/89     Pulse Rate 70     Resp 18     Temp 98.8 F (37.1 C)     Temp Source Oral     SpO2 99 %     Weight 150 lb (68 kg)     Height 4\' 11"  (1.499 m)     Head Circumference      Peak Flow      Pain Score      Pain Loc      Pain Edu?      Excl. in GC?     Constitutional: Alert and oriented. Well appearing and in no acute distress. Cardiovascular: Normal rate, regular rhythm. Grossly normal heart sounds.  Good peripheral circulation. Respiratory: Normal respiratory effort.  No retractions. Lungs CTAB. Musculoskeletal: No lower extremity tenderness nor edema.  No joint effusions. Neurologic:  Normal speech and language. No  gross focal neurologic deficits are appreciated. No gait instability. Skin:  Skin is warm, dry and intact. Papular lesions bilateral forearm and popliteal area. Psychiatric: Mood and affect are normal. Speech and behavior are normal.  ____________________________________________   LABS (all labs ordered are listed, but only abnormal results are displayed)  Labs Reviewed - No data to display ____________________________________________  EKG   ____________________________________________  RADIOLOGY  No results  found.  ____________________________________________   PROCEDURES  Procedure(s) performed: None  Procedures  Critical Care performed: No  ____________________________________________   INITIAL IMPRESSION / ASSESSMENT AND PLAN / ED COURSE  Pertinent labs & imaging results that were available during my care of the patient were reviewed by me and considered in my medical decision making (see chart for details).  Miliaria. Patient given discharge Instructions. Patient advised follow-up with dermatology his condition does not resolve in 3-5 days.      ____________________________________________   FINAL CLINICAL IMPRESSION(S) / ED DIAGNOSES  Final diagnoses:  Miliaria      NEW MEDICATIONS STARTED DURING THIS VISIT:  New Prescriptions   HYDROCORTISONE VALERATE OINTMENT (WESTCORT) 0.2 %    Apply to affected area daily   HYDROXYZINE (ATARAX/VISTARIL) 50 MG TABLET    Take 1 tablet (50 mg total) by mouth 3 (three) times daily as needed.     Note:  This document was prepared using Dragon voice recognition software and may include unintentional dictation errors.    Joni Reining, PA-C 03/26/17 [id number]    Nita Sickle, MD 03/29/17 [phone]

## 2018-05-05 ENCOUNTER — Emergency Department: Payer: 59

## 2018-05-05 ENCOUNTER — Encounter: Payer: Self-pay | Admitting: Emergency Medicine

## 2018-05-05 ENCOUNTER — Emergency Department
Admission: EM | Admit: 2018-05-05 | Discharge: 2018-05-05 | Disposition: A | Discharge status: Home / Self Care | Payer: 59 | Attending: Emergency Medicine | Admitting: Emergency Medicine

## 2018-05-05 ENCOUNTER — Other Ambulatory Visit: Payer: Self-pay

## 2018-05-05 DIAGNOSIS — Z79899 Other long term (current) drug therapy: Secondary | ICD-10-CM | POA: Insufficient documentation

## 2018-05-05 DIAGNOSIS — R1011 Right upper quadrant pain: Secondary | ICD-10-CM

## 2018-05-05 DIAGNOSIS — F17200 Nicotine dependence, unspecified, uncomplicated: Secondary | ICD-10-CM | POA: Insufficient documentation

## 2018-05-05 DIAGNOSIS — R1013 Epigastric pain: Secondary | ICD-10-CM | POA: Diagnosis present

## 2018-05-05 LAB — CBC
HCT: 34.8 % — ABNORMAL LOW (ref 35.0–47.0)
HEMOGLOBIN: 11.9 g/dL — AB (ref 12.0–16.0)
MCH: 28.2 pg (ref 26.0–34.0)
MCHC: 34.2 g/dL (ref 32.0–36.0)
MCV: 82.3 fL (ref 80.0–100.0)
Platelets: 356 10*3/uL (ref 150–440)
RBC: 4.23 MIL/uL (ref 3.80–5.20)
RDW: 14.2 % (ref 11.5–14.5)
WBC: 8.3 10*3/uL (ref 3.6–11.0)

## 2018-05-05 LAB — COMPREHENSIVE METABOLIC PANEL
ALBUMIN: 4.1 g/dL (ref 3.5–5.0)
ALK PHOS: 58 U/L (ref 38–126)
ALT: 18 U/L (ref 0–44)
AST: 21 U/L (ref 15–41)
Anion gap: 7 (ref 5–15)
BUN: 8 mg/dL (ref 6–20)
CALCIUM: 8.7 mg/dL — AB (ref 8.9–10.3)
CHLORIDE: 110 mmol/L (ref 98–111)
CO2: 22 mmol/L (ref 22–32)
CREATININE: 0.76 mg/dL (ref 0.44–1.00)
GFR calc Af Amer: >60 mL/min (ref >60)
GFR calc non Af Amer: >60 mL/min (ref >60)
GLUCOSE: 109 mg/dL — AB (ref 70–99)
Potassium: 3.7 mmol/L (ref 3.5–5.1)
SODIUM: 139 mmol/L (ref 135–145)
Total Bilirubin: 0.4 mg/dL (ref 0.3–1.2)
Total Protein: 7.6 g/dL (ref 6.5–8.1)

## 2018-05-05 MED ORDER — SODIUM CHLORIDE 0.9 % IV BOLUS
1000.0000 mL | Freq: Once | INTRAVENOUS | Status: AC
  Administered 2018-05-05: 1000 mL via INTRAVENOUS

## 2018-05-05 MED ORDER — GI COCKTAIL ~~LOC~~
30.0000 mL | Freq: Once | ORAL | Status: AC
  Administered 2018-05-05: 30 mL via ORAL
  Filled 2018-05-05: qty 30

## 2018-05-05 MED ORDER — ONDANSETRON HCL 4 MG/2ML IJ SOLN
4.0000 mg | Freq: Once | INTRAMUSCULAR | Status: AC
  Administered 2018-05-05: 4 mg via INTRAVENOUS

## 2018-05-05 MED ORDER — MORPHINE SULFATE (PF) 2 MG/ML IV SOLN
INTRAVENOUS | Status: AC
  Administered 2018-05-05: 2 mg
  Filled 2018-05-05: qty 1

## 2018-05-05 MED ORDER — SUCRALFATE 1 G PO TABS
1.0000 g | ORAL_TABLET | Freq: Once | ORAL | Status: AC
  Administered 2018-05-05: 1 g via ORAL
  Filled 2018-05-05: qty 1

## 2018-05-05 MED ORDER — MORPHINE SULFATE (PF) 2 MG/ML IV SOLN
2.0000 mg | Freq: Once | INTRAVENOUS | Status: AC
  Administered 2018-05-05: 2 mg

## 2018-05-05 MED ORDER — ONDANSETRON HCL 4 MG/2ML IJ SOLN
INTRAMUSCULAR | Status: AC
  Filled 2018-05-05: qty 2

## 2018-05-05 MED ORDER — SUCRALFATE 1 G PO TABS: 1.0000 g | ORAL_TABLET | Freq: Two times a day (BID) | ORAL | 1 refills | Status: DC

## 2018-05-05 MED ORDER — PANTOPRAZOLE SODIUM 40 MG PO TBEC: 40.0000 mg | DELAYED_RELEASE_TABLET | Freq: Every day | ORAL | 0 refills | Status: DC

## 2018-05-05 MED ORDER — PANTOPRAZOLE SODIUM 40 MG PO TBEC
40.0000 mg | DELAYED_RELEASE_TABLET | Freq: Once | ORAL | Status: AC
  Administered 2018-05-05: 40 mg via ORAL
  Filled 2018-05-05: qty 1

## 2018-05-05 NOTE — ED Provider Notes (Signed)
[facility] Emergency Department Provider Note    First MD Initiated Contact with Patient 07[facility]/30/19 [phone]     (approximate)  I have reviewed the triage vital signs and the nursing notes.   HISTORY  Chief Complaint Abdominal Pain    HPI Samantha Mcclure is a 37 y.o. female presents to the emergency department stating "I think I have an ulcer".  Patient admits to previous ulcer disease and states that she woke up at 4:00 AM this morning with 10 out of 10 epigastric abdominal discomfort that is nonradiating.  Patient denies any nausea vomiting or diarrhea.  Patient denies any constipation.  Patient denies any urinary symptoms.  Patient denies any fever.   Past Medical History:  Diagnosis Date  . Pulmonary embolism (HCC)   . Ulcerative colitis (HCC)     There are no active problems to display for this patient.   Past Surgical History:  Procedure Laterality Date  . CESAREAN SECTION      Prior to Admission medications   Medication Sig Start Date End Date Taking? Authorizing Provider  hydrOXYzine (ATARAX/VISTARIL) 50 MG tablet Take 1 tablet (50 mg total) by mouth 3 (three) times daily as needed. 03/26/17   Joni ReiningSmith, Ronald K, PA-C  ondansetron (ZOFRAN ODT) 8 MG disintegrating tablet Take 1 tablet (8 mg total) by mouth every 8 (eight) hours as needed for nausea or vomiting. 06/30/15   Sharman CheekStafford, Phillip, MD  ranitidine (ZANTAC) 150 MG capsule Take 1 capsule (150 mg total) by mouth 2 (two) times daily. 06/30/15   Sharman CheekStafford, Phillip, MD  sucralfate (CARAFATE) 1 G tablet Take 1 tablet (1 g total) by mouth 4 (four) times daily. 06/30/15   Sharman CheekStafford, Phillip, MD  traMADol Janean Sark(ULTRAM) 50 MG tablet Take one tablet q 6 hours if not driving or operating machines 08/10/15   Tommi RumpsSummers, Rhonda L, PA-C    Allergies Nsaids  No family history on file.  Social History Social History   Tobacco Use  . Smoking status: Current Every Day Smoker  . Smokeless tobacco: Never Used  Substance  Use Topics  . Alcohol use: No  . Drug use: No    Review of Systems Constitutional: No fever/chills Eyes: No visual changes. ENT: No sore throat. Cardiovascular: Denies chest pain. Respiratory: Denies shortness of breath. Gastrointestinal: Positive for abdominal pain.  No nausea, no vomiting.  No diarrhea.  No constipation. Genitourinary: Negative for dysuria. Musculoskeletal: Negative for neck pain.  Negative for back pain. Integumentary: Negative for rash. Neurological: Negative for headaches, focal weakness or numbness.   ____________________________________________   PHYSICAL EXAM:  VITAL SIGNS: ED Triage Vitals  Enc Vitals Group     BP 05/05/18 0446 134/80     Pulse Rate 05/05/18 0446 91     Resp 05/05/18 0446 18     Temp 05/05/18 0446 98.1 F (36.7 C)     Temp Source 05/05/18 0446 Oral     SpO2 05/05/18 0446 99 %     Weight 05/05/18 0445 72.6 kg (160 lb)     Height 05/05/18 0445 1.499 m (4\' 11" )     Head Circumference --      Peak Flow --      Pain Score 05/05/18 0445 10     Pain Loc --      Pain Edu? --      Excl. in GC? --     Constitutional: Alert and oriented. Well appearing and in no acute distress. Eyes: Conjunctivae are normal.  Head: Atraumatic.  Mouth/Throat: Mucous membranes are moist.  Oropharynx non-erythematous. Neck: No stridor.   Cardiovascular: Normal rate, regular rhythm. Good peripheral circulation. Grossly normal heart sounds. Respiratory: Normal respiratory effort.  No retractions. Lungs CTAB. Gastrointestinal: Epigastric tenderness to palpation. No distention.  Musculoskeletal: No lower extremity tenderness nor edema. No gross deformities of extremities. Neurologic:  Normal speech and language. No gross focal neurologic deficits are appreciated.  Skin:  Skin is warm, dry and intact. No rash noted. Psychiatric: Mood and affect are normal. Speech and behavior are normal.  ____________________________________________   LABS (all labs  ordered are listed, but only abnormal results are displayed)  Labs Reviewed  CBC - Abnormal; Notable for the following components:      Result Value   Hemoglobin 11.9 (*)    HCT 34.8 (*)    All other components within normal limits  COMPREHENSIVE METABOLIC PANEL - Abnormal; Notable for the following components:   Glucose, Bld 109 (*)    Calcium 8.7 (*)    All other components within normal limits    RADIOLOGY I, [place] BROWN, personally viewed and evaluated these images (plain radiographs) as part of my medical decision making, as well as reviewing the written report by the radiologist.  ED MD interpretation: Normal right upper quadrant ultrasound  Official radiology report(s): US Abdomen Limited Ruq  Result Date: 05/05/2018 CLINICAL DATA:  RIGHT upper quadrant epigastric pain for 2 hours. History of ulcerative colitis. EXAM: ULTRASOUND ABDOMEN LIMITED RIGHT UPPER QUADRANT COMPARISON:  None. FINDINGS: Gallbladder: No gallstones or wall thickening visualized. No sonographic Murphy sign noted by sonographer. Common bile duct: Diameter: 3 mm Liver: No focal lesion identified. Mildly increased parenchymal echogenicity. Portal vein is patent on color Doppler imaging with normal direction of blood flow towards the liver. IMPRESSION: 1. No acute RIGHT upper quadrant process. 2. Mild hepatic steatosis. Electronically Signed   By: Awilda Metro M.D.   On: 05/05/2018 06:15      Procedures   ____________________________________________   INITIAL IMPRESSION / ASSESSMENT AND PLAN / ED COURSE  As part of my medical decision making, I reviewed the following data within the electronic MEDICAL RECORD NUMBER   37 year old female presenting with above-stated history and physical exam secondary to epigastric abdominal pain.  Patient had right upper quadrant tenderness to palpation as such ultrasound was performed which revealed no evidence of cholelithiasis or cholecystitis.  Concern for possible  ulcer disease and as such patient was given GI cocktail IV morphine and Zofran.  Patient will be given Carafate as well patient be referred to Dr. Tobi Bastos gastroenterologist for further outpatient evaluation and management. ____________________________________________  FINAL CLINICAL IMPRESSION(S) / ED DIAGNOSES  Final diagnoses:  RUQ pain  Epigastric pain     MEDICATIONS GIVEN DURING THIS VISIT:  Medications  morphine 2 MG/ML injection 2 mg (2 mg Combination Given 05/05/18 0524)  ondansetron (ZOFRAN) injection 4 mg (4 mg Intravenous Given 05/05/18 0524)  sodium chloride 0.9 % bolus 1,000 mL (1,000 mLs Intravenous New Bag/Given 05/05/18 0523)  gi cocktail (Maalox,Lidocaine,Donnatal) (30 mLs Oral Given 05/05/18 0615)  pantoprazole (PROTONIX) EC tablet 40 mg (40 mg Oral Given 05/05/18 1610)     ED Discharge Orders    None       Note:  This document was prepared using Dragon voice recognition software and may include unintentional dictation errors.    Darci Current, MD 05/05/18 [phone]

## 2018-05-05 NOTE — ED Notes (Signed)
Pt to the er for pain in abd that began at 0400. Pt reports pain to the top of her belly. No nausea or vomiting. Pt Has palpable pain to the ruq. Pt states she has been dx with ulcer in the past.

## 2018-05-05 NOTE — ED Notes (Signed)
Upon entering room, pt was on cell phone playing a game. Pt has upper abd pain since 4am, rates pain 10/10. Denies any n/v/d.

## 2018-05-05 NOTE — ED Triage Notes (Signed)
Patient ambulatory to triage with steady gait, without difficulty or distress noted; pt reports awoke at 4am with upper abd pain with no accomp symptoms; st "I think I have an ulcer'

## 2018-05-18 ENCOUNTER — Encounter: Payer: Self-pay | Admitting: Gastroenterology

## 2018-05-18 ENCOUNTER — Ambulatory Visit (INDEPENDENT_AMBULATORY_CARE_PROVIDER_SITE_OTHER): Payer: 59 | Admitting: Gastroenterology

## 2018-05-18 ENCOUNTER — Other Ambulatory Visit: Payer: Self-pay

## 2018-05-18 ENCOUNTER — Other Ambulatory Visit
Admission: RE | Admit: 2018-05-18 | Discharge: 2018-05-18 | Disposition: A | Discharge status: Home / Self Care | Payer: 59 | Source: Ambulatory Visit | Attending: Gastroenterology | Admitting: Gastroenterology

## 2018-05-18 VITALS — BP 118/74 | HR 114 | Ht 59.0 in | Wt 160.0 lb

## 2018-05-18 DIAGNOSIS — N920 Excessive and frequent menstruation with regular cycle: Secondary | ICD-10-CM

## 2018-05-18 DIAGNOSIS — R109 Unspecified abdominal pain: Secondary | ICD-10-CM | POA: Diagnosis not present

## 2018-05-18 DIAGNOSIS — D649 Anemia, unspecified: Secondary | ICD-10-CM

## 2018-05-18 DIAGNOSIS — K219 Gastro-esophageal reflux disease without esophagitis: Secondary | ICD-10-CM

## 2018-05-18 DIAGNOSIS — K5900 Constipation, unspecified: Secondary | ICD-10-CM

## 2018-05-18 LAB — VITAMIN B12: VITAMIN B 12: 159 pg/mL — AB (ref 180–914)

## 2018-05-18 LAB — IRON AND TIBC
Iron: 52 ug/dL (ref 28–170)
Saturation Ratios: 11 % (ref 10.4–31.8)
TIBC: 457 ug/dL — ABNORMAL HIGH (ref 250–450)
UIBC: 405 ug/dL

## 2018-05-18 LAB — FERRITIN: FERRITIN: 6 ng/mL — AB (ref 11–307)

## 2018-05-18 LAB — FOLATE: Folate: 6.2 ng/mL (ref >5.9)

## 2018-05-18 NOTE — Progress Notes (Signed)
Samantha MoodKiran [name] MD, MRCP(U.K) [address]  [address]  [place], [place] [address]  Main: [phone]  Fax: [phone]   Gastroenterology Consultation  Referring Provider:     No ref. provider found Primary Care Physician:  Patient, No Pcp Per Primary Gastroenterologist:  Dr. Wyline MoodKiran Sharnee Mcclure  Reason for Consultation:     Abdominal pain         HPI:   Samantha Mcclure is a 37 y.o. y/o female ,She has been referred for abdominal pain.  She was seen in the emergency room on 05/05/2018 with abdominal pain.  She was given a GI, and ultrasound was negative and showed on Humira steatosis.  She was discharged to follow-up in my office. Labs: Hemoglobin 11 point and MCV of 82.3.  Liver function tests were normal hemoglobin back in April 2018 was 12.4 g and normal.  She says someone told her in the past she has ulcerative colitis but has never had a colonoscopy .   Abdominal pain: Onset: She says she woke up at 4 am with severe abdominal pain and went to the ER, the pain began the day before , pain resolved 3 days after ER visit. She has had this pain 2-3 years back, when she consumes a lot o sodas or spicy food. The pain is in her epigastrium and feels that the food does not go down well. She tries not to eat , she barely has a bowel movement , she used to have one everyday , 2-3 years back it changed. She has a bowel movement once a week, consistency can be like stones, denies any blood. She says that her uncle had colon cancer , no first degree relatives, stopped drinking sodas and lost a few lbs. She has heavy periods. It lasts 5-7 days and changes 4-5 heavily soaked pads.   NSAID use: no  PPI use :no  Gall bladder surgery: no    She does have acid reflux. She recalls ulcerative colitis was diagnosed in the ER in the past but denies any diarrhea or rectal bleeding.   Past Medical History:  Diagnosis Date  . Pulmonary embolism (HCC)   . Ulcerative colitis [facility](HCC)     Past Surgical History:    Procedure Laterality Date  . CESAREAN SECTION      Prior to Admission medications   Medication Sig Start Date End Date Taking? Authorizing Provider  hydrOXYzine (ATARAX/VISTARIL) 50 MG tablet Take 1 tablet (50 mg total) by mouth 3 (three) times daily as needed. 03/26/17   Joni ReiningSmith, Ronald K, PA-C  IRON, FERROUS SULFATE, PO take 1 by Oral route 2 times every day 12/16/12   [provider]  ondansetron (ZOFRAN ODT) 8 MG disintegrating tablet Take 1 tablet (8 mg total) by mouth every 8 (eight) hours as needed for nausea or vomiting. 06/30/15   Sharman CheekStafford, Phillip, MD  pantoprazole (PROTONIX) 40 MG tablet Take 1 tablet (40 mg total) by mouth daily. 05/05/18 06/04/18  Darci CurrentBrown, [place] N, MD  ranitidine (ZANTAC) 150 MG capsule Take 1 capsule (150 mg total) by mouth 2 (two) times daily. 06/30/15   Sharman CheekStafford, Phillip, MD  sucralfate (CARAFATE) 1 G tablet Take 1 tablet (1 g total) by mouth 4 (four) times daily. 06/30/15   Sharman CheekStafford, Phillip, MD  sucralfate (CARAFATE) 1 g tablet Take 1 tablet (1 g total) by mouth 2 (two) times daily for 10 days. 05/05/18 05/15/18  Darci CurrentBrown, Ralston N, MD  traMADol (ULTRAM) 50 MG tablet Take one tablet q 6 hours  if not driving or operating machines 08/10/15   Tommi RumpsSummers, Rhonda L, PA-C    No family history on file.   Social History   Tobacco Use  . Smoking status: Current Every Day Smoker  . Smokeless tobacco: Never Used  Substance Use Topics  . Alcohol use: No  . Drug use: No    Allergies as of 05/18/2018 - Review Complete 05/05/2018  Allergen Reaction Noted  . Nsaids Other (See Comments) 08/10/2015    Review of Systems:    All systems reviewed and negative except where noted in HPI.   Physical Exam:  There were no vitals taken for this visit. No LMP recorded. Psych:  Alert and cooperative. Normal mood and affect. General:   Alert,  Well-developed, well-nourished, pleasant and cooperative in NAD Head:  Normocephalic and atraumatic. Eyes:  Sclera clear, no icterus.    Conjunctiva pink. Ears:  Normal auditory acuity. Nose:  No deformity, discharge, or lesions. Mouth:  No deformity or lesions,oropharynx pink & moist. Neck:  Supple; no masses or thyromegaly. Lungs:  Respirations even and unlabored.  Clear throughout to auscultation.   No wheezes, crackles, or rhonchi. No acute distress. Heart:  Regular rate and rhythm; no murmurs, clicks, rubs, or gallops. Abdomen:  Normal bowel sounds.  No bruits.  Soft, non-tender and non-distended without masses, hepatosplenomegaly or hernias noted.  No guarding or rebound tenderness.    Neurologic:  Alert and oriented x3;  grossly normal neurologically. Skin:  Intact without significant lesions or rashes. No jaundice. Lymph Nodes:  No significant cervical adenopathy. Psych:  Alert and cooperative. Normal mood and affect.  Imaging Studies: [place] Abdomen Limited Ruq  Result Date: 05/05/2018 CLINICAL DATA:  RIGHT upper quadrant epigastric pain for 2 hours. History of ulcerative colitis. EXAM: ULTRASOUND ABDOMEN LIMITED RIGHT UPPER QUADRANT COMPARISON:  None. FINDINGS: Gallbladder: No gallstones or wall thickening visualized. No sonographic Murphy sign noted by sonographer. Common bile duct: Diameter: 3 mm Liver: No focal lesion identified. Mildly increased parenchymal echogenicity. Portal vein is patent on color Doppler imaging with normal direction of blood flow towards the liver. IMPRESSION: 1. No acute RIGHT upper quadrant process. 2. Mild hepatic steatosis. Electronically Signed   By: Awilda Metroourtnay  Bloomer M.D.   On: 05/05/2018 06:15    Assessment and Plan:   Samantha Mcclure is a 37 y.o. y/o female has been referred for abdominal pain.  Note mild anemia borderline microcytosis.History suggestive of GERD,constipation. No history suggestive of ulcerative colitis. She has a history of menorrhagia    Plan 1.  Check iron studies B12 folate.   2. Refer to GYN 3. GERD patient information  4. High fiber diet patient information  5.  Linzess 145 mcg for constipation , samples provided 6. Since no abdominal pain at this time will not evaluate further. If recurs then will do so.     Follow up in 6 weeks  Dr Samantha MoodKiran Tiawana Forgy MD,MRCP(U.K)

## 2018-05-19 ENCOUNTER — Other Ambulatory Visit: Payer: Self-pay

## 2018-05-19 ENCOUNTER — Telehealth: Payer: Self-pay

## 2018-05-19 DIAGNOSIS — N920 Excessive and frequent menstruation with regular cycle: Secondary | ICD-10-CM

## 2018-05-19 NOTE — Telephone Encounter (Signed)
-----   Message from Wyline MoodKiran Anna, MD sent at 05/19/2018  8:23 AM EDT ----- Cera Rorke  Inform has low iron and very low b12  1. Commence on oral b12 1000mcg per day  2. Ferrous sulphate 300 mg BID 3. See her GYN for menorrhagia- if they feel its not source of her anemia then will need GI work up 4. Forward results to PCP

## 2018-05-19 NOTE — Telephone Encounter (Signed)
LVM for pt to return my call.

## 2018-05-19 NOTE — Telephone Encounter (Signed)
Pt returned my call and results were discussed.   Referral to [facility] has been placed.

## 2018-07-06 ENCOUNTER — Ambulatory Visit: Payer: 59 | Admitting: Gastroenterology

## 2018-08-11 ENCOUNTER — Ambulatory Visit: Payer: 59 | Admitting: Gastroenterology

## 2018-09-28 ENCOUNTER — Encounter: Payer: Self-pay | Admitting: Gastroenterology

## 2018-09-28 ENCOUNTER — Ambulatory Visit: Payer: 59 | Admitting: Gastroenterology

## 2018-09-28 DIAGNOSIS — R109 Unspecified abdominal pain: Secondary | ICD-10-CM

## 2019-03-22 ENCOUNTER — Emergency Department
Admission: EM | Admit: 2019-03-22 | Discharge: 2019-03-22 | Disposition: A | Discharge status: Home / Self Care | Payer: 59 | Attending: Emergency Medicine | Admitting: Emergency Medicine

## 2019-03-22 ENCOUNTER — Other Ambulatory Visit: Payer: Self-pay

## 2019-03-22 DIAGNOSIS — Z86711 Personal history of pulmonary embolism: Secondary | ICD-10-CM | POA: Insufficient documentation

## 2019-03-22 DIAGNOSIS — M436 Torticollis: Secondary | ICD-10-CM | POA: Diagnosis not present

## 2019-03-22 DIAGNOSIS — Z79899 Other long term (current) drug therapy: Secondary | ICD-10-CM | POA: Diagnosis not present

## 2019-03-22 DIAGNOSIS — F17200 Nicotine dependence, unspecified, uncomplicated: Secondary | ICD-10-CM | POA: Diagnosis not present

## 2019-03-22 DIAGNOSIS — M542 Cervicalgia: Secondary | ICD-10-CM | POA: Diagnosis present

## 2019-03-22 LAB — GLUCOSE, CAPILLARY: Glucose-Capillary: 155 mg/dL — ABNORMAL HIGH (ref 70–99)

## 2019-03-22 MED ORDER — CYCLOBENZAPRINE HCL 10 MG PO TABS: 10.0000 mg | ORAL_TABLET | Freq: Three times a day (TID) | ORAL | 0 refills | Status: AC | PRN

## 2019-03-22 MED ORDER — DIAZEPAM 2 MG PO TABS
2.0000 mg | ORAL_TABLET | Freq: Once | ORAL | Status: AC
  Administered 2019-03-22: 06:00:00 2 mg via ORAL
  Filled 2019-03-22: qty 1

## 2019-03-22 MED ORDER — CYCLOBENZAPRINE HCL 10 MG PO TABS
10.0000 mg | ORAL_TABLET | Freq: Once | ORAL | Status: AC
  Administered 2019-03-22: 10 mg via ORAL
  Filled 2019-03-22: qty 1

## 2019-03-22 NOTE — ED Triage Notes (Signed)
Pt states has right sided neck pain for three days. Pt states states she has had 2-3 years of intermittent shob with associated sweating and "mouth watering". Pt appears like she has torticollis. Pt denies current shob.

## 2019-03-22 NOTE — ED Notes (Signed)
Pt states she can call her son for ride home if she is too sleepy to drive at time of discharge

## 2019-03-22 NOTE — ED Notes (Signed)
Pt resting in lobby in no acute distress.  

## 2019-03-22 NOTE — ED Notes (Signed)
Spoke with dr. Owens Shark regarding pt's symptoms. Order for flexeril and cbg received. No order for ekg or cxr given.

## 2019-03-22 NOTE — ED Provider Notes (Signed)
[facility] Emergency Department Provider Note ___   First MD Initiated Contact with Patient 03/22/19 0535     (approximate)  I have reviewed the triage vital signs and the nursing notes.   HISTORY  Chief Complaint Neck Pain   HPI Samantha Mcclure is a 38 y.o. female presents to the emergency department nontraumatic right neck pain upon awakening this morning.  Patient admits to a previous history of torticollis in the past.  Patient states that current pain score is 8 out of 10 and worse with any movement of the neck      Past Medical History:  Diagnosis Date  . Pulmonary embolism (HCC)   . Ulcerative colitis [facility](HCC)     Patient Active Problem List   Diagnosis Date Noted  . Ganglion cyst of dorsum of right wrist 02/07/2017  . Hx of pulmonary embolus 08/07/2016  . 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 CESAREAN SECTION      Prior to Admission medications   Medication Sig Start Date End Date Taking? Authorizing Provider  cyclobenzaprine (FLEXERIL) 10 MG tablet Take 1 tablet (10 mg total) by mouth 3 (three) times daily as needed. 03/22/19   Darci CurrentBrown, Hope N, MD  hydrOXYzine (ATARAX/VISTARIL) 50 MG tablet Take 1 tablet (50 mg total) by mouth 3 (three) times daily as needed. Patient not taking: Reported on 05/18/2018 03/26/17   Joni ReiningSmith, Ronald K, PA-C  IRON, FERROUS SULFATE, PO take 1 by Oral route 2 times every day 12/16/12   [provider]  ondansetron (ZOFRAN ODT) 8 MG disintegrating tablet Take 1 tablet (8 mg total) by mouth every 8 (eight) hours as needed for nausea or vomiting. Patient not taking: Reported on 05/18/2018 06/30/15   Sharman CheekStafford, Phillip, MD  pantoprazole (PROTONIX) 40 MG tablet Take 1 tablet (40 mg total) by mouth daily. 05/05/18 06/04/18  Darci CurrentBrown, [place] N, MD  ranitidine (ZANTAC) 150 MG capsule Take 1 capsule (150 mg total) by mouth 2 (two) times daily. Patient not  taking: Reported on 05/18/2018 06/30/15   Sharman CheekStafford, Phillip, MD  sucralfate (CARAFATE) 1 G tablet Take 1 tablet (1 g total) by mouth 4 (four) times daily. 06/30/15   Sharman CheekStafford, Phillip, MD  sucralfate (CARAFATE) 1 g tablet Take 1 tablet (1 g total) by mouth 2 (two) times daily for 10 days. 05/05/18 05/15/18  Darci CurrentBrown, [place] N, MD  traMADol Janean Sark(ULTRAM) 50 MG tablet Take one tablet q 6 hours if not driving or operating machines Patient not taking: Reported on 05/18/2018 08/10/15   Tommi RumpsSummers, Rhonda L, PA-C    Allergies Nsaids  No family history on file.  Social History Social History   Tobacco Use  . Smoking status: Current Every Day Smoker  . Smokeless tobacco: Never Used  Substance Use Topics  . Alcohol use: No  . Drug use: No    Review of Systems Constitutional: No fever/chills Eyes: No visual changes. ENT: No sore throat. Cardiovascular: Denies chest pain. Respiratory: Denies shortness of breath. Gastrointestinal: No abdominal pain.  No nausea, no vomiting.  No diarrhea.  No constipation. Genitourinary: Negative for dysuria. Musculoskeletal: Positive for neck pain.  Negative for back pain. Integumentary: Negative for rash. Neurological: Negative for headaches, focal weakness or numbness.  ____________________________________________   PHYSICAL EXAM:  VITAL SIGNS: ED Triage Vitals  Enc Vitals Group     BP 03/22/19 0149 128/72     Pulse Rate 03/22/19 0149 83  Resp 03/22/19 0149 16     Temp 03/22/19 0149 98 F (36.7 C)     Temp Source 03/22/19 0149 Oral     SpO2 03/22/19 0149 100 %     Weight 03/22/19 0150 74.8 kg (165 lb)     Height 03/22/19 0150 1.499 m (4\' 11" )     Head Circumference --      Peak Flow --      Pain Score 03/22/19 0149 8     Pain Loc --      Pain Edu? --      Excl. in [place]? --     Constitutional: Alert and oriented. Well appearing and in no acute distress. Eyes: Conjunctivae are normal.  Mouth/Throat: Mucous membranes are moist. Oropharynx  non-erythematous. Neck: No stridor.   Cardiovascular: Normal rate, regular rhythm. Good peripheral circulation. Grossly normal heart sounds. Respiratory: Normal respiratory effort.  No retractions. No audible wheezing. Gastrointestinal: Soft and nontender. No distention.  Musculoskeletal: Pain to palpation in the right trapezius muscle  neurologic:  Normal speech and language. No gross focal neurologic deficits are appreciated.  Skin:  Skin is warm, dry and intact. No rash noted. Psychiatric: Mood and affect are normal. Speech and behavior are normal.  ____________________________________________   LABS (all labs ordered are listed, but only abnormal results are displayed)  Labs Reviewed  GLUCOSE, CAPILLARY - Abnormal; Notable for the following components:      Result Value   Glucose-Capillary 155 (*)    All other components within normal limits  CBG MONITORING, ED       Procedures   ____________________________________________   INITIAL IMPRESSION / MDM / ASSESSMENT AND PLAN / ED COURSE  As part of my medical decision making, I reviewed the following data within the electronic MEDICAL RECORD NUMBER   38 year old female presenting with above-stated history and physical exam consistent with acute torticollis.  Patient given Flexeril without any improvement of pain.  Patient subsequently given 2 mg of p.o. Valium with pain improvement  *Samantha Mcclure was evaluated in Emergency Department on 03/22/2019 for the symptoms described in the history of present illness. She was evaluated in the context of the global COVID-19 pandemic, which necessitated consideration that the patient might be at risk for infection with the SARS-CoV-2 virus that causes COVID-19. Institutional protocols and algorithms that pertain to the evaluation of patients at risk for COVID-19 are in a state of rapid change based on information released by regulatory bodies including the CDC and federal and state  organizations. These policies and algorithms were followed during the patient's care in the ED.  Some ED evaluations and interventions may be delayed as a result of limited staffing during the pandemic.*    ____________________________________________  FINAL CLINICAL IMPRESSION(S) / ED DIAGNOSES  Final diagnoses:  Torticollis, acute     MEDICATIONS GIVEN DURING THIS VISIT:  Medications  cyclobenzaprine (FLEXERIL) tablet 10 mg (10 mg Oral Given 03/22/19 0206)  diazepam (VALIUM) tablet 2 mg (2 mg Oral Given 03/22/19 0609)     ED Discharge Orders         Ordered    cyclobenzaprine (FLEXERIL) 10 MG tablet  3 times daily PRN     03/22/19 0622           Note:  This document was prepared using Dragon voice recognition software and may include unintentional dictation errors.   Gregor Hams, MD 03/22/19 [phone]

## 2019-12-31 ENCOUNTER — Encounter: Payer: BC Managed Care – PPO | Attending: Physician Assistant | Admitting: *Deleted

## 2019-12-31 ENCOUNTER — Other Ambulatory Visit: Payer: Self-pay

## 2019-12-31 ENCOUNTER — Encounter: Payer: Self-pay | Admitting: *Deleted

## 2019-12-31 VITALS — BP 110/80 | Ht 59.0 in | Wt 161.6 lb

## 2019-12-31 DIAGNOSIS — Z713 Dietary counseling and surveillance: Secondary | ICD-10-CM | POA: Diagnosis not present

## 2019-12-31 DIAGNOSIS — E119 Type 2 diabetes mellitus without complications: Secondary | ICD-10-CM | POA: Diagnosis not present

## 2019-12-31 NOTE — Patient Instructions (Addendum)
Check blood sugars 2 x day before breakfast and 2 hrs after one meal every day or as directed by MD Bring blood sugar records to the next class  Exercise: Begin walking for 15  minutes 3-4 days a week and gradually increase to 30 minutes 5 days a week  Eat 3 meals day,  1-2  snacks a day Space meals 4-6 hours apart Don't skip meals Avoid sugar sweetened drinks (tea, coffee)  Quit  smoking  Make an eye doctor appointment  Return for classes on:

## 2019-12-31 NOTE — Progress Notes (Signed)
Diabetes Self-Management Education  Visit Type: First/Initial  Appt. Start Time: 0900 Appt. End Time: 1000  12/31/2019  [name], identified by name and date of birth, is a 39 y.o. female with a diagnosis of Diabetes: Type 2.   ASSESSMENT  Blood pressure 110/80, height 4\' 11"  (1.499 m), weight 161 lb 9.6 oz (73.3 kg). Body mass index is 32.64 kg/m.  Diabetes Self-Management Education - 12/31/19 1023      Visit Information   Visit Type  First/Initial      Initial Visit   Diabetes Type  Type 2    Are you currently following a meal plan?  Yes    What type of meal plan do you follow?  "changed what I eat and seasonings I use"    Are you taking your medications as prescribed?  No   Not taking medications prescribed for ulcerative colitis   Date Diagnosed  March 2021      Health Coping   How would you rate your overall health?  Good      Psychosocial Assessment   Patient Belief/Attitude about Diabetes  Other (comment)   "no type of way"   Self-care barriers  None    Self-management support  Doctor's office    Patient Concerns  Nutrition/Meal planning;Medication;Monitoring;Healthy Lifestyle    Special Needs  None    Preferred Learning Style  Visual;Hands on    Learning Readiness  Change in progress    How often do you need to have someone help you when you read instructions, pamphlets, or other written materials from your doctor or pharmacy?  1 - Never    What is the last grade level you completed in school?  bachelors      Pre-Education Assessment   Patient understands the diabetes disease and treatment process.  Needs Instruction    Patient understands incorporating nutritional management into lifestyle.  Needs Instruction    Patient undertands incorporating physical activity into lifestyle.  Needs Instruction    Patient understands using medications safely.  Needs Instruction    Patient understands monitoring blood glucose, interpreting and using results  Needs  Review    Patient understands prevention, detection, and treatment of acute complications.  Needs Instruction    Patient understands prevention, detection, and treatment of chronic complications.  Needs Instruction    Patient understands how to develop strategies to address psychosocial issues.  Needs Instruction    Patient understands how to develop strategies to promote health/change behavior.  Needs Instruction      Complications   Last HgB A1C per patient/outside source  9.3 %   12/09/2019   How often do you check your blood sugar?  3-4 times/day    Fasting Blood glucose range (mg/dL)  70-129;130-179   She reports FBG's 114-138 mg/dL   Postprandial Blood glucose range (mg/dL)  --   Pt reports readings before lunch 134-194 mg/dL and bedtime readings 113-185 mg/dL.   Have you had a dilated eye exam in the past 12 months?  No    Have you had a dental exam in the past 12 months?  Yes    Are you checking your feet?  Yes    How many days per week are you checking your feet?  3      Dietary Intake   Breakfast  [place] biscuit - sausage and egg or bacon biscuit with grits and no butter - doesn't eat all of the bread    Snack (morning)  varies during the  day - crackers, pretzels, nuts, SF pudding or jello, fruit (apple, grapefruit)    Lunch  left overs    Dinner  beef, chicken, [place] with potatoes, green beans, corn, occasional lima beans, rice, occasional pasta, cuccumbers, onions, peppers, greens, cabbage    Beverage(s)  water, SF Gatorade, tea and coffee with some sugar      Exercise   Exercise Type  ADL's      Patient Education   Previous Diabetes Education  No    Disease state   Definition of diabetes, type 1 and 2, and the diagnosis of diabetes;Factors that contribute to the development of diabetes    Nutrition management   Role of diet in the treatment of diabetes and the relationship between the three main macronutrients and blood glucose level;Food label reading, portion  sizes and measuring food.;Reviewed blood glucose goals for pre and post meals and how to evaluate the patients' food intake on their blood glucose level.    Physical activity and exercise   Role of exercise on diabetes management, blood pressure control and cardiac health.    Medications  Reviewed patients medication for diabetes, action, purpose, timing of dose and side effects.    Monitoring  Purpose and frequency of SMBG.;Taught/discussed recording of test results and interpretation of SMBG.;Identified appropriate SMBG and/or A1C goals.    Acute complications  Taught treatment of hypoglycemia - the 15 rule.;Other (comment)   Pt had questions about hypoglycemia.   Chronic complications  Relationship between chronic complications and blood glucose control    Psychosocial adjustment  Identified and addressed patients feelings and concerns about diabetes    Personal strategies to promote health  Review risk of smoking and offered smoking cessation      Individualized Goals (developed by patient)   Reducing Risk  Other (comment)   Decrease medications, prevent diabetes complications, lead a healthier lifestyle, quit smoking, become more fit     Outcomes   Expected Outcomes  Demonstrated interest in learning. Expect positive outcomes    Future DMSE  4-6 wks       Individualized Plan for Diabetes Self-Management Training:   Learning Objective:  Patient will have a greater understanding of diabetes self-management. Patient education plan is to attend individual and/or group sessions per assessed needs and concerns.   Plan:   Patient Instructions  Check blood sugars 2 x day before breakfast and 2 hrs after one meal every day or as directed by MD Bring blood sugar records to the next class Exercise: Begin walking for 15  minutes 3-4 days a week and gradually increase to 30 minutes 5 days a week Eat 3 meals day,  1-2  snacks a day Space meals 4-6 hours apart Don't skip meals Avoid sugar  sweetened drinks (tea, coffee) Quit  smoking Make an eye doctor appointment  Expected Outcomes:  Demonstrated interest in learning. Expect positive outcomes  Education material provided:  General Meal Planning Guidelines Simple Meal Plan Symptoms, causes and treatments of Hypoglycemia  If problems or questions, patient to contact team via:  Sharion Settler, RN, CCM, CDCES [phone]  Future DSME appointment: 4-6 wks  January 31, 2020 for Diabetes Class 1

## 2020-01-31 ENCOUNTER — Other Ambulatory Visit: Payer: Self-pay

## 2020-01-31 ENCOUNTER — Encounter: Payer: BC Managed Care – PPO | Attending: Physician Assistant | Admitting: Dietician

## 2020-01-31 ENCOUNTER — Encounter: Payer: Self-pay | Admitting: Dietician

## 2020-01-31 VITALS — Ht 59.0 in | Wt 163.7 lb

## 2020-01-31 DIAGNOSIS — E119 Type 2 diabetes mellitus without complications: Secondary | ICD-10-CM | POA: Insufficient documentation

## 2020-01-31 DIAGNOSIS — Z713 Dietary counseling and surveillance: Secondary | ICD-10-CM | POA: Insufficient documentation

## 2020-01-31 NOTE — Progress Notes (Signed)

## 2020-02-28 ENCOUNTER — Other Ambulatory Visit: Payer: Self-pay

## 2020-02-28 ENCOUNTER — Encounter: Payer: Self-pay | Admitting: *Deleted

## 2020-02-28 ENCOUNTER — Encounter: Payer: BC Managed Care – PPO | Attending: Physician Assistant | Admitting: *Deleted

## 2020-02-28 VITALS — Wt 168.1 lb

## 2020-02-28 DIAGNOSIS — Z713 Dietary counseling and surveillance: Secondary | ICD-10-CM | POA: Diagnosis not present

## 2020-02-28 DIAGNOSIS — E119 Type 2 diabetes mellitus without complications: Secondary | ICD-10-CM | POA: Insufficient documentation

## 2020-02-28 NOTE — Progress Notes (Signed)

## 2020-04-03 ENCOUNTER — Encounter: Payer: BC Managed Care – PPO | Attending: Physician Assistant

## 2020-04-03 ENCOUNTER — Other Ambulatory Visit: Payer: Self-pay

## 2020-04-03 DIAGNOSIS — Z713 Dietary counseling and surveillance: Secondary | ICD-10-CM | POA: Insufficient documentation

## 2020-04-03 DIAGNOSIS — E119 Type 2 diabetes mellitus without complications: Secondary | ICD-10-CM | POA: Insufficient documentation

## 2020-04-04 ENCOUNTER — Encounter: Payer: Self-pay | Admitting: Dietician

## 2020-04-04 NOTE — Progress Notes (Signed)
Pt missed diabetes education group class (class 3 of 3) yesterday 6/28.  Spoke with pt to reschedule.  Pt agreed to attend next class on 8/2.

## 2020-05-08 ENCOUNTER — Other Ambulatory Visit: Payer: Self-pay

## 2020-05-08 ENCOUNTER — Encounter: Payer: BC Managed Care – PPO | Attending: Physician Assistant | Admitting: Dietician

## 2020-05-08 ENCOUNTER — Encounter: Payer: Self-pay | Admitting: Dietician

## 2020-05-08 VITALS — BP 118/62 | Ht 59.0 in | Wt 164.2 lb

## 2020-05-08 DIAGNOSIS — Z713 Dietary counseling and surveillance: Secondary | ICD-10-CM | POA: Insufficient documentation

## 2020-05-08 DIAGNOSIS — E119 Type 2 diabetes mellitus without complications: Secondary | ICD-10-CM | POA: Insufficient documentation

## 2020-05-08 NOTE — Progress Notes (Signed)

## 2020-05-09 ENCOUNTER — Encounter: Payer: Self-pay | Admitting: *Deleted

## 2021-01-24 ENCOUNTER — Encounter: Payer: Self-pay | Admitting: *Deleted

## 2021-05-21 ENCOUNTER — Other Ambulatory Visit: Payer: Self-pay | Admitting: Physician Assistant

## 2021-05-21 DIAGNOSIS — Z1231 Encounter for screening mammogram for malignant neoplasm of breast: Secondary | ICD-10-CM

## 2021-06-05 ENCOUNTER — Ambulatory Visit
Admission: RE | Admit: 2021-06-05 | Discharge: 2021-06-05 | Disposition: A | Discharge status: Home / Self Care | Payer: BC Managed Care – PPO | Source: Ambulatory Visit | Attending: Physician Assistant | Admitting: Physician Assistant

## 2021-06-05 ENCOUNTER — Other Ambulatory Visit: Payer: Self-pay

## 2021-06-05 DIAGNOSIS — Z1231 Encounter for screening mammogram for malignant neoplasm of breast: Secondary | ICD-10-CM | POA: Diagnosis present

## 2021-06-13 ENCOUNTER — Other Ambulatory Visit: Payer: Self-pay | Admitting: Physician Assistant

## 2021-06-13 DIAGNOSIS — N6489 Other specified disorders of breast: Secondary | ICD-10-CM

## 2021-06-13 DIAGNOSIS — R928 Other abnormal and inconclusive findings on diagnostic imaging of breast: Secondary | ICD-10-CM

## 2021-06-20 ENCOUNTER — Ambulatory Visit
Admission: RE | Admit: 2021-06-20 | Discharge: 2021-06-20 | Disposition: A | Discharge status: Home / Self Care | Payer: BC Managed Care – PPO | Source: Ambulatory Visit | Attending: Physician Assistant | Admitting: Physician Assistant

## 2021-06-20 ENCOUNTER — Other Ambulatory Visit: Payer: Self-pay

## 2021-06-20 DIAGNOSIS — R928 Other abnormal and inconclusive findings on diagnostic imaging of breast: Secondary | ICD-10-CM | POA: Diagnosis not present

## 2021-06-20 DIAGNOSIS — N6489 Other specified disorders of breast: Secondary | ICD-10-CM

## 2021-06-26 ENCOUNTER — Emergency Department
Admission: EM | Admit: 2021-06-26 | Discharge: 2021-06-27 | Disposition: A | Discharge status: Home / Self Care | Payer: BC Managed Care – PPO | Attending: Emergency Medicine | Admitting: Emergency Medicine

## 2021-06-26 ENCOUNTER — Emergency Department (HOSPITAL_COMMUNITY)
Admission: EM | Admit: 2021-06-26 | Discharge: 2021-06-26 | Discharge status: Absent without leave | Payer: BC Managed Care – PPO

## 2021-06-26 ENCOUNTER — Emergency Department: Payer: BC Managed Care – PPO

## 2021-06-26 ENCOUNTER — Encounter: Payer: Self-pay | Admitting: Emergency Medicine

## 2021-06-26 ENCOUNTER — Other Ambulatory Visit: Payer: Self-pay

## 2021-06-26 DIAGNOSIS — R110 Nausea: Secondary | ICD-10-CM | POA: Insufficient documentation

## 2021-06-26 DIAGNOSIS — R1010 Upper abdominal pain, unspecified: Secondary | ICD-10-CM | POA: Diagnosis not present

## 2021-06-26 DIAGNOSIS — Z7984 Long term (current) use of oral hypoglycemic drugs: Secondary | ICD-10-CM | POA: Diagnosis not present

## 2021-06-26 DIAGNOSIS — F17210 Nicotine dependence, cigarettes, uncomplicated: Secondary | ICD-10-CM | POA: Diagnosis not present

## 2021-06-26 DIAGNOSIS — E119 Type 2 diabetes mellitus without complications: Secondary | ICD-10-CM | POA: Insufficient documentation

## 2021-06-26 DIAGNOSIS — N390 Urinary tract infection, site not specified: Secondary | ICD-10-CM

## 2021-06-26 DIAGNOSIS — K859 Acute pancreatitis without necrosis or infection, unspecified: Secondary | ICD-10-CM

## 2021-06-26 LAB — BASIC METABOLIC PANEL
Anion gap: 9 (ref 5–15)
BUN: 10 mg/dL (ref 6–20)
CO2: 23 mmol/L (ref 22–32)
Calcium: 9.5 mg/dL (ref 8.9–10.3)
Chloride: 105 mmol/L (ref 98–111)
Creatinine, Ser: 0.72 mg/dL (ref 0.44–1.00)
GFR, Estimated: >60 mL/min (ref >60)
Glucose, Bld: 143 mg/dL — ABNORMAL HIGH (ref 70–99)
Potassium: 3.9 mmol/L (ref 3.5–5.1)
Sodium: 137 mmol/L (ref 135–145)

## 2021-06-26 LAB — CBC
HCT: 34.2 % — ABNORMAL LOW (ref 36.0–46.0)
Hemoglobin: 11 g/dL — ABNORMAL LOW (ref 12.0–15.0)
MCH: 25.1 pg — ABNORMAL LOW (ref 26.0–34.0)
MCHC: 32.2 g/dL (ref 30.0–36.0)
MCV: 78.1 fL — ABNORMAL LOW (ref 80.0–100.0)
Platelets: 448 10*3/uL — ABNORMAL HIGH (ref 150–400)
RBC: 4.38 MIL/uL (ref 3.87–5.11)
RDW: 14.9 % (ref 11.5–15.5)
WBC: 13.7 10*3/uL — ABNORMAL HIGH (ref 4.0–10.5)
nRBC: 0 % (ref 0.0–0.2)

## 2021-06-26 LAB — TROPONIN I (HIGH SENSITIVITY): Troponin I (High Sensitivity): <2 ng/L (ref <18)

## 2021-06-26 LAB — LIPASE, BLOOD: Lipase: 188 U/L — ABNORMAL HIGH (ref 11–51)

## 2021-06-26 MED ORDER — ONDANSETRON HCL 4 MG/2ML IJ SOLN
4.0000 mg | Freq: Once | INTRAMUSCULAR | Status: AC
  Administered 2021-06-26: 4 mg via INTRAVENOUS
  Filled 2021-06-26: qty 2

## 2021-06-26 MED ORDER — SODIUM CHLORIDE 0.9 % IV BOLUS (SEPSIS)
1000.0000 mL | Freq: Once | INTRAVENOUS | Status: AC
  Administered 2021-06-26: 1000 mL via INTRAVENOUS

## 2021-06-26 MED ORDER — ONDANSETRON 4 MG PO TBDP
4.0000 mg | ORAL_TABLET | Freq: Once | ORAL | Status: AC
  Administered 2021-06-26: 4 mg via ORAL
  Filled 2021-06-26: qty 1

## 2021-06-26 MED ORDER — HYDROMORPHONE HCL 1 MG/ML IJ SOLN
1.0000 mg | Freq: Once | INTRAMUSCULAR | Status: AC
  Administered 2021-06-26: 1 mg via INTRAVENOUS
  Filled 2021-06-26: qty 1

## 2021-06-26 MED ORDER — HYDROCODONE-ACETAMINOPHEN 5-325 MG PO TABS
1.0000 | ORAL_TABLET | Freq: Once | ORAL | Status: AC
  Administered 2021-06-26: 1 via ORAL
  Filled 2021-06-26: qty 1

## 2021-06-26 NOTE — ED Provider Notes (Signed)
[facility] Emergency Department Provider Note  ____________________________________________   Event Date/Time   First MD Initiated Contact with Patient 06/26/21 2311     (approximate)  I have reviewed the triage vital signs and the nursing notes.   HISTORY  Chief Complaint Abdominal Pain    HPI Samantha Mcclure is a 40 y.o. female with history of ulcerative colitis, diabetes who presents to the emergency department with complaints of severe, sharp upper abdominal pain with nausea since Saturday, September 17.  No fevers, chills, vomiting, diarrhea, bloody stool, melena.  Currently on Flagyl for trichomonas.  Has had previous C-section.  States she has had similar symptoms before and was told it was her ulcerative colitis.  She states she is not on any medications for ulcerative colitis.        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 Authorizing Provider  cyclobenzaprine (FLEXERIL) 10 MG tablet Take 1 tablet (10 mg total) by mouth 3 (three) times daily as needed. 03/22/19   Darci Current, MD  hydrOXYzine (ATARAX/VISTARIL) 50 MG tablet Take 1 tablet (50 mg total) by mouth 3 (three) times daily as needed. 03/26/17   Joni Reining, PA-C  IRON, FERROUS SULFATE, PO take 1 by Oral route 2 times every day 12/16/12   [provider]  metFORMIN (GLUCOPHAGE) 500 MG tablet Take 500 mg by mouth 2 (two) times daily. 12/15/19 12/14/20  [provider]  ondansetron (ZOFRAN ODT) 8 MG disintegrating  tablet Take 1 tablet (8 mg total) by mouth every 8 (eight) hours as needed for nausea or vomiting. Patient not taking: Reported on 05/18/2018 06/30/15   Sharman Cheek, MD  pantoprazole (PROTONIX) 40 MG tablet Take by mouth.    [provider]  ranitidine (ZANTAC) 150 MG capsule Take 1 capsule (150 mg total) by mouth 2 (two) times daily. Patient not taking: Reported on 05/18/2018 06/30/15   Sharman Cheek, MD  sucralfate (CARAFATE) 1 G tablet Take 1 tablet (1 g total) by mouth 4 (four) times daily. Patient not taking: Reported on 12/31/2019 06/30/15   Sharman Cheek, MD  traMADol Janean Sark) 50 MG tablet Take one tablet q 6 hours if not driving or operating machines 08/10/15   Tommi Rumps, PA-C    Allergies Nsaids  Family History  Problem Relation Age of Onset   Diabetes Mother     Social History Social History   Tobacco Use   Smoking status: Every Day    Packs/day: 0.35    Years: 21.00    Pack years: 7.35    Types: Cigarettes   Smokeless tobacco: Never   Tobacco comments:    6-7 cigarettes per day  Substance Use Topics   Alcohol use: No   Drug use: No    Review of Systems Constitutional: No fever. Eyes: No visual changes. ENT: No sore throat. Cardiovascular: Denies chest pain. Respiratory: Denies shortness of breath. Gastrointestinal: No vomiting, diarrhea. Genitourinary: Negative for dysuria. Musculoskeletal: Negative for back pain. Skin:  Negative for rash. Neurological: Negative for focal weakness or numbness.  ____________________________________________   PHYSICAL EXAM:  VITAL SIGNS: ED Triage Vitals  Enc Vitals Group     BP 06/26/21 1637 137/90     Pulse Rate 06/26/21 1637 (!) 117     Resp 06/26/21 1637 20     Temp 06/26/21 1637 98.6 F (37 C)     Temp Source 06/26/21 1637 Oral     SpO2 06/26/21 1637 98 %     Weight 06/26/21 1638 163 lb (73.9 kg)     Height 06/26/21 1638 4\' 11"  (1.499 m)     Head Circumference --      Peak Flow --       Pain Score 06/26/21 1637 10     Pain Loc --      Pain Edu? --      Excl. in GC? --    CONSTITUTIONAL: Alert and oriented and responds appropriately to questions.  Appears uncomfortable but not in distress HEAD: Normocephalic EYES: Conjunctivae clear, pupils appear equal, EOM appear intact ENT: normal nose; moist mucous membranes NECK: Supple, normal ROM CARD: Regular and tachycardic; S1 and S2 appreciated; no murmurs, no clicks, no rubs, no gallops RESP: Normal chest excursion without splinting or tachypnea; breath sounds clear and equal bilaterally; no wheezes, no rhonchi, no rales, no hypoxia or respiratory distress, speaking full sentences ABD/GI: Normal bowel sounds; non-distended; soft, tender throughout the upper abdomen, positive Murphy sign, no tenderness at McBurney's point BACK: The back appears normal EXT: Normal ROM in all joints; no deformity noted, no edema; no cyanosis SKIN: Normal color for age and race; warm; no rash on exposed skin NEURO: Moves all extremities equally PSYCH: The patient's mood and manner are appropriate.  ____________________________________________   LABS (all labs ordered are listed, but only abnormal results are displayed)  Labs Reviewed  BASIC METABOLIC PANEL - Abnormal; Notable for the following components:      Result Value   Glucose, Bld 143 (*)    All other components within normal limits  CBC - Abnormal; Notable for the following components:   WBC 13.7 (*)    Hemoglobin 11.0 (*)    HCT 34.2 (*)    MCV 78.1 (*)    MCH 25.1 (*)    Platelets 448 (*)    All other components within normal limits  LIPASE, BLOOD - Abnormal; Notable for the following components:   Lipase 188 (*)    All other components within normal limits  URINALYSIS, ROUTINE W REFLEX MICROSCOPIC  PREGNANCY, URINE  HEPATIC FUNCTION PANEL  TROPONIN I (HIGH SENSITIVITY)  TROPONIN I (HIGH SENSITIVITY)   ____________________________________________  EKG   EKG  Interpretation  Date/Time:  Tuesday June 26 2021 16:40:12 EDT Ventricular Rate:  115 PR Interval:  120 QRS Duration: 78 QT Interval:  310 QTC Calculation: 428 R Axis:   64 Text Interpretation: Sinus tachycardia Otherwise normal ECG Confirmed by [id number] [phone]) on 06/26/2021 11:44:20 PM        ____________________________________________  RADIOLOGY 06/28/2021 Consuello Lassalle, personally viewed and evaluated these images (plain radiographs) as part of my medical decision making, as well as reviewing the written report by the radiologist.  ED MD interpretation:  Xrays unremarkable.    Official radiology report(s): DG Chest 2 View  Result Date: 06/26/2021 CLINICAL DATA:  Epigastric pain EXAM: CHEST - 2 VIEW COMPARISON:  01/24/2017 FINDINGS: Cardiac shadow is within normal limits. Lungs are well aerated bilaterally. Mild right basilar atelectasis is noted.  No sizable effusion is seen. No bony abnormality is noted. IMPRESSION: Mild right basilar atelectasis. Electronically Signed   By: Alcide Clever M.D.   On: 06/26/2021 17:50   DG Abdomen 1 View  Result Date: 06/26/2021 CLINICAL DATA:  Abdominal pain EXAM: ABDOMEN - 1 VIEW COMPARISON:  None. FINDINGS: Scattered large and small bowel gas is noted. No obstructive changes are seen. No free air is noted. No abnormal mass or abnormal calcifications are seen. Bony structures appear within normal limits. IMPRESSION: No acute abnormality noted. Electronically Signed   By: Alcide Clever M.D.   On: 06/26/2021 17:50    ____________________________________________   PROCEDURES  Procedure(s) performed (including Critical Care):  Procedures   ____________________________________________   INITIAL IMPRESSION / ASSESSMENT AND PLAN / ED COURSE  As part of my medical decision making, I reviewed the following data within the electronic MEDICAL RECORD NUMBER Nursing notes reviewed and incorporated, Labs reviewed , EKG interpreted , Old EKG reviewed, Old  chart reviewed, Patient signed out to oncoming EDP, Radiograph reviewed , and Notes from prior ED visits         Patient here with complaints of upper abdominal pain.  Labs obtained from triage show elevated lipase.  LFTs pending.  She also has a leukocytosis of 13,000 with left shift and has a positive Murphy sign on my exam.  Concern for possible cholecystitis, choledocholithiasis, gallstone pancreatitis, cholangitis.  Low suspicion for ACS.  Troponin negative.  EKG nonischemic.  Chest x-ray clear.  Will obtain right upper quadrant ultrasound.  If this is unremarkable, anticipate CT of the abdomen pelvis and possible admission.  Will give IV fluids, pain and nausea medicine.  ED PROGRESS  Signed out to Dr. York Cerise to follow-up on additional labs, imaging.   I reviewed all nursing notes and pertinent previous records as available.  I have reviewed and interpreted any EKGs, lab and urine results, imaging (as available).  ____________________________________________   FINAL CLINICAL IMPRESSION(S) / ED DIAGNOSES  Final diagnoses:  Upper abdominal pain     ED Discharge Orders     None       *Please note:  Shalisha Clausing was evaluated in Emergency Department on 06/26/2021 for the symptoms described in the history of present illness. She was evaluated in the context of the global COVID-19 pandemic, which necessitated consideration that the patient might be at risk for infection with the SARS-CoV-2 virus that causes COVID-19. Institutional protocols and algorithms that pertain to the evaluation of patients at risk for COVID-19 are in a state of rapid change based on information released by regulatory bodies including the CDC and federal and state organizations. These policies and algorithms were followed during the patient's care in the ED.  Some ED evaluations and interventions may be delayed as a result of limited staffing during and the pandemic.*   Note:  This document was prepared  using Dragon voice recognition software and may include unintentional dictation errors.    Leny Morozov, Layla Maw, DO 06/27/21 0007

## 2021-06-26 NOTE — ED Provider Notes (Signed)
Emergency Medicine Provider Triage Evaluation Note  Samantha Mcclure , a 40 y.o. female  was evaluated in triage.  Pt complains of epigastric pain that began 3 days ago.  Pain comes and goes.  At times she feels nauseous and diaphoretic.  No chest pain or shortness of breath.  Has not had much appetite no fevers, chills..  Review of Systems  Positive: Epigastric pain, back pain nausea Negative: Vomiting, fevers, chills, chest pain, shortness of breath  Physical Exam  BP 137/90 (BP Location: Right Arm)   Pulse (!) 117   Temp 98.6 F (37 C) (Oral)   Resp 20   Ht 4\' 11"  (1.499 m)   Wt 73.9 kg   SpO2 98%   BMI 32.92 kg/m  Gen:   Awake, no distress   Resp:  Normal effort no wheezing rales or rhonchi MSK:   Moves extremities without difficulty  Other:  Abdomen soft, mild distention  Medical Decision Making  Medically screening exam initiated at 4:47 PM.  Appropriate orders placed.  Samantha Mcclure was informed that the remainder of the evaluation will be completed by another provider, this initial triage assessment does not replace that evaluation, and the importance of remaining in the ED until their evaluation is complete.     Benjamin Stain, PA-C 06/26/21 1650    06/28/21, MD 06/26/21 2226

## 2021-06-26 NOTE — ED Triage Notes (Signed)
Pt reports that she developed upper gastric pain that began on Saturday. She is having diaphoresis and nausea.

## 2021-06-27 ENCOUNTER — Emergency Department: Payer: BC Managed Care – PPO

## 2021-06-27 LAB — URINALYSIS, ROUTINE W REFLEX MICROSCOPIC
Glucose, UA: NEGATIVE mg/dL
Ketones, ur: 15 mg/dL — AB
Nitrite: POSITIVE — AB
Protein, ur: 30 mg/dL — AB
Specific Gravity, Urine: >1.03 — ABNORMAL HIGH (ref 1.005–1.030)
pH: 5.5 (ref 5.0–8.0)

## 2021-06-27 LAB — HEPATIC FUNCTION PANEL
ALT: 15 U/L (ref 0–44)
AST: 15 U/L (ref 15–41)
Albumin: 4.2 g/dL (ref 3.5–5.0)
Alkaline Phosphatase: 65 U/L (ref 38–126)
Bilirubin, Direct: <0.1 mg/dL (ref 0.0–0.2)
Total Bilirubin: 0.6 mg/dL (ref 0.3–1.2)
Total Protein: 8.2 g/dL — ABNORMAL HIGH (ref 6.5–8.1)

## 2021-06-27 LAB — URINALYSIS, MICROSCOPIC (REFLEX): Bacteria, UA: NONE SEEN

## 2021-06-27 LAB — TROPONIN I (HIGH SENSITIVITY): Troponin I (High Sensitivity): <2 ng/L (ref <18)

## 2021-06-27 LAB — PREGNANCY, URINE: Preg Test, Ur: NEGATIVE

## 2021-06-27 MED ORDER — ONDANSETRON 4 MG PO TBDP: ORAL_TABLET | ORAL | 0 refills | Status: AC

## 2021-06-27 MED ORDER — CEPHALEXIN 500 MG PO CAPS: 500.0000 mg | ORAL_CAPSULE | Freq: Two times a day (BID) | ORAL | 0 refills | Status: DC

## 2021-06-27 MED ORDER — OXYCODONE-ACETAMINOPHEN 5-325 MG PO TABS
2.0000 | ORAL_TABLET | Freq: Once | ORAL | Status: AC
Start: 2021-06-27 — End: 2021-06-27
  Administered 2021-06-27: 2 via ORAL
  Filled 2021-06-27: qty 2

## 2021-06-27 MED ORDER — ONDANSETRON 4 MG PO TBDP: ORAL_TABLET | ORAL | 0 refills | Status: DC

## 2021-06-27 MED ORDER — CEPHALEXIN 500 MG PO CAPS: 500.0000 mg | ORAL_CAPSULE | Freq: Two times a day (BID) | ORAL | 0 refills | Status: AC

## 2021-06-27 MED ORDER — IOHEXOL 350 MG/ML SOLN
80.0000 mL | Freq: Once | INTRAVENOUS | Status: AC | PRN
  Administered 2021-06-27: 80 mL via INTRAVENOUS

## 2021-06-27 MED ORDER — SODIUM CHLORIDE 0.9 % IV SOLN
1.0000 g | Freq: Once | INTRAVENOUS | Status: AC
  Administered 2021-06-27: 1 g via INTRAVENOUS
  Filled 2021-06-27: qty 10

## 2021-06-27 MED ORDER — OXYCODONE-ACETAMINOPHEN 5-325 MG PO TABS: 2.0000 | ORAL_TABLET | Freq: Four times a day (QID) | ORAL | 0 refills | Status: AC | PRN

## 2021-06-27 MED ORDER — OXYCODONE-ACETAMINOPHEN 5-325 MG PO TABS: 2.0000 | ORAL_TABLET | Freq: Four times a day (QID) | ORAL | 0 refills | Status: DC | PRN

## 2021-06-27 NOTE — ED Notes (Signed)
PO pain meds and gingerale provided to patient per MD request for PO challenge.

## 2021-06-27 NOTE — ED Notes (Signed)
Patient stable and discharged with all personal belongings and AVS. AVS and discharge instructions reviewed with patient and opportunity for questions provided.   

## 2021-06-27 NOTE — Discharge Instructions (Addendum)
As we discussed, your workup and physical exam today suggest that your symptoms are caused by a condition called pancreatitis.  Your symptoms are relatively mild, and we discussed bringing you into the hospital, but you preferred to try staying at home first.  That is very understandable and should be fine, but you need to stick to the eating plan recommendations included in these documents.  Please also read through the information about pancreatitis in general.  Avoid drinking alcohol.  (You also have a mild urinary tract infection and we recommend you continue taking the medication you are on as well as the new prescription for Keflex, or cephalexin, which is an antibiotic that will help with your UTI.)  Take Percocet as prescribed for severe pain. Do not drink alcohol, drive or participate in any other potentially dangerous activities while taking this medication as it may make you sleepy. Do not take this medication with any other sedating medications, either prescription or over-the-counter. If you were prescribed Percocet or Vicodin, do not take these with acetaminophen (Tylenol) as it is already contained within these medications.   This medication is an opiate (or narcotic) pain medication and can be habit forming.  Use it as little as possible to achieve adequate pain control.  Do not use or use it with extreme caution if you have a history of opiate abuse or dependence.  If you are on a pain contract with your primary care doctor or a pain specialist, be sure to let them know you were prescribed this medication today from the [facility] Emergency Department.  This medication is intended for your use only - do not give any to anyone else and keep it in a secure place where nobody else, especially children, have access to it.  It will also cause or worsen constipation, so you may want to consider taking an over-the-counter stool softener while you are taking this medication.  Return to the  emergency department if you develop new or worsening symptoms that concern you, including but not limited to worsening abdominal pain, persistent vomiting, fever, or inability to eat or drink anything as a result of the other symptoms.

## 2021-06-27 NOTE — ED Provider Notes (Signed)
-----------------------------------------   12:21 AM on 06/27/2021 -----------------------------------------  Assuming care from Dr. Elesa Massed.  In short, Samantha Mcclure is a 40 y.o. female with a chief complaint of abdominal pain.  Refer to the original H&P for additional details.  The current plan of care is to follow up labs and imaging and reassess.   ----------------------------------------- 2:36 AM on 06/27/2021 -----------------------------------------  Lipase elevated but hepatic function tests normal.  Normal metabolic panel.  Mild leukocytosis.  Positive UTI, received ceftriaxone 1 g IV.  Ultrasound unremarkable, CT with IV contrast shows acute uncomplicated pancreatitis.  I discussed the results with the patient and offered admission.  However she prefers to go home and given that this seems to be a relatively mild pancreatitis I think that is reasonable.  She tolerated a p.o. challenge and her pain was not gone but adequately controlled and she wants to go home.  I provided Percocet and Zofran and a prescription for Keflex for the UTI and strongly encourage close outpatient follow-up.  I also provided the pancreatitis eating plan and recommendations.  I gave strict return precautions.   Loleta Rose, MD 06/27/21 [phone]

## 2021-06-28 LAB — URINE CULTURE: Culture: <10000 — AB

## 2022-06-14 DIAGNOSIS — N920 Excessive and frequent menstruation with regular cycle: Secondary | ICD-10-CM | POA: Diagnosis not present

## 2022-06-14 DIAGNOSIS — Z124 Encounter for screening for malignant neoplasm of cervix: Secondary | ICD-10-CM | POA: Diagnosis not present

## 2022-06-14 DIAGNOSIS — Z01419 Encounter for gynecological examination (general) (routine) without abnormal findings: Secondary | ICD-10-CM | POA: Diagnosis not present

## 2022-07-19 DIAGNOSIS — E119 Type 2 diabetes mellitus without complications: Secondary | ICD-10-CM | POA: Diagnosis not present

## 2022-07-19 DIAGNOSIS — R739 Hyperglycemia, unspecified: Secondary | ICD-10-CM | POA: Diagnosis not present

## 2022-07-19 DIAGNOSIS — R519 Headache, unspecified: Secondary | ICD-10-CM | POA: Diagnosis not present

## 2022-07-26 ENCOUNTER — Other Ambulatory Visit: Payer: BC Managed Care – PPO

## 2022-09-10 DIAGNOSIS — E119 Type 2 diabetes mellitus without complications: Secondary | ICD-10-CM | POA: Diagnosis not present

## 2022-09-10 DIAGNOSIS — K219 Gastro-esophageal reflux disease without esophagitis: Secondary | ICD-10-CM | POA: Diagnosis not present

## 2022-09-16 DIAGNOSIS — Z0000 Encounter for general adult medical examination without abnormal findings: Secondary | ICD-10-CM | POA: Diagnosis not present

## 2022-09-16 DIAGNOSIS — N390 Urinary tract infection, site not specified: Secondary | ICD-10-CM | POA: Diagnosis not present

## 2022-09-16 DIAGNOSIS — Z8719 Personal history of other diseases of the digestive system: Secondary | ICD-10-CM | POA: Diagnosis not present

## 2022-09-16 DIAGNOSIS — E119 Type 2 diabetes mellitus without complications: Secondary | ICD-10-CM | POA: Diagnosis not present

## 2022-09-16 DIAGNOSIS — Z862 Personal history of diseases of the blood and blood-forming organs and certain disorders involving the immune mechanism: Secondary | ICD-10-CM | POA: Diagnosis not present

## 2022-09-16 DIAGNOSIS — Z114 Encounter for screening for human immunodeficiency virus [HIV]: Secondary | ICD-10-CM | POA: Diagnosis not present

## 2022-10-24 ENCOUNTER — Other Ambulatory Visit: Payer: BC Managed Care – PPO

## 2022-10-31 ENCOUNTER — Ambulatory Visit: Admit: 2022-10-31 | Payer: BC Managed Care – PPO | Admitting: Obstetrics and Gynecology

## 2022-10-31 SURGERY — XI ROBOTIC ASSISTED LAPAROSCOPIC HYSTERECTOMY AND SALPINGECTOMY
Anesthesia: Choice | Laterality: Bilateral

## 2023-03-25 DIAGNOSIS — Z862 Personal history of diseases of the blood and blood-forming organs and certain disorders involving the immune mechanism: Secondary | ICD-10-CM | POA: Diagnosis not present

## 2023-03-25 DIAGNOSIS — E119 Type 2 diabetes mellitus without complications: Secondary | ICD-10-CM | POA: Diagnosis not present

## 2023-06-09 DIAGNOSIS — N390 Urinary tract infection, site not specified: Secondary | ICD-10-CM | POA: Diagnosis not present

## 2023-06-18 IMAGING — US US ABDOMEN LIMITED
1 series · 14 of 25 positions shown · non-contrast
Comparison: CT 05/23/2014

CLINICAL DATA: Epigastric pain for 3 days.

EXAM:
ULTRASOUND ABDOMEN LIMITED RIGHT UPPER QUADRANT

[Series 1: us abdomen limited ruq (liver/gb) · 14 of 45 slices shown]
[im 1/45]
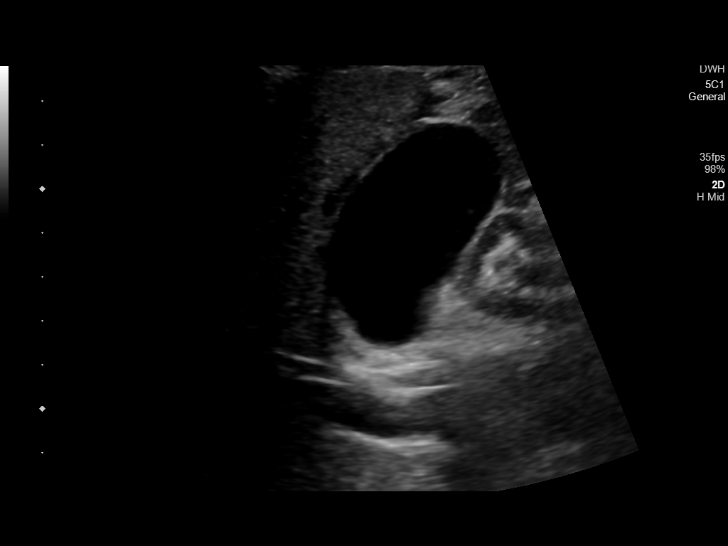
[im 4/45]
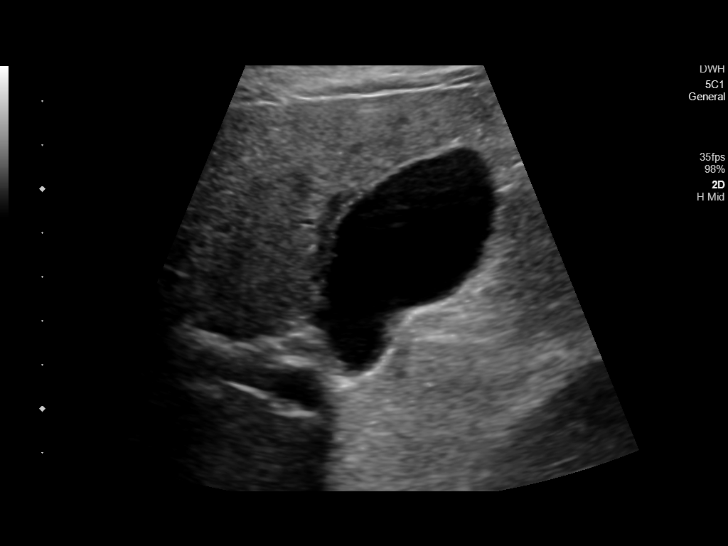
[im 8/45]
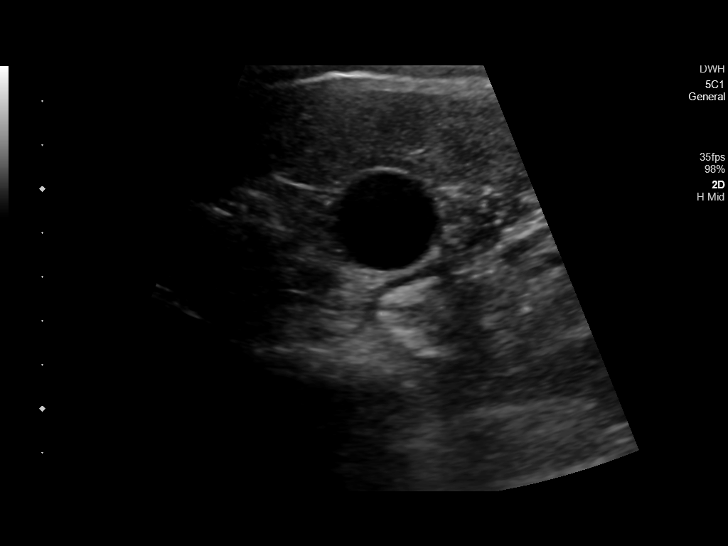
[im 12/45]
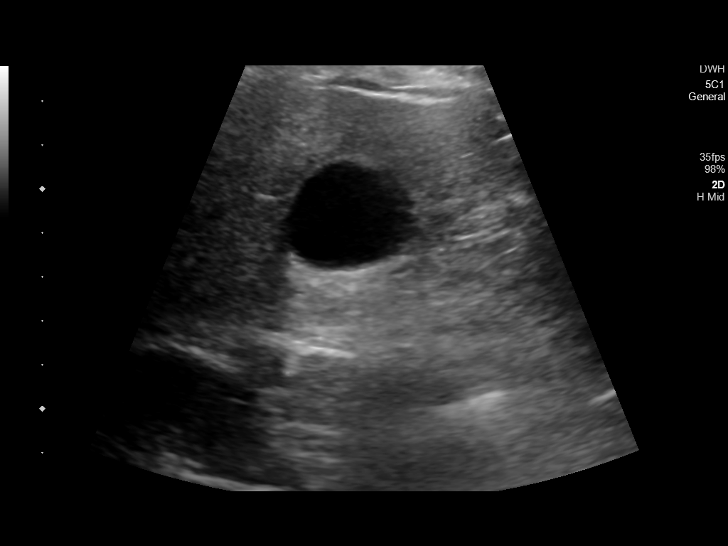
[im 15/45]
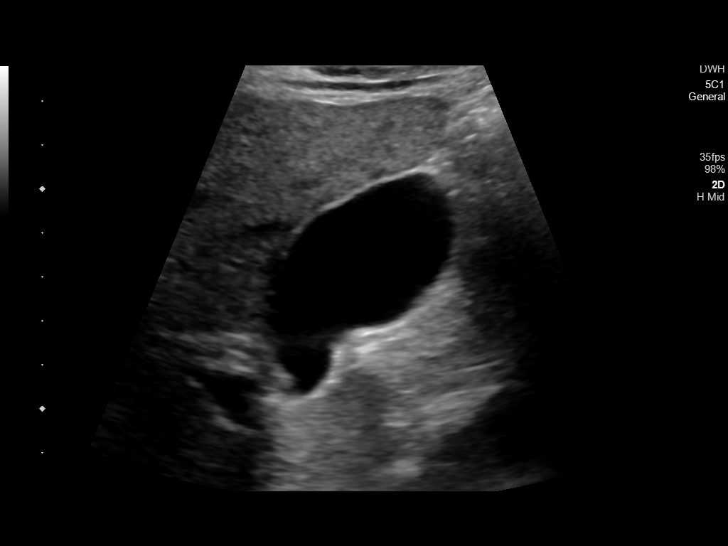
[im 17/45]
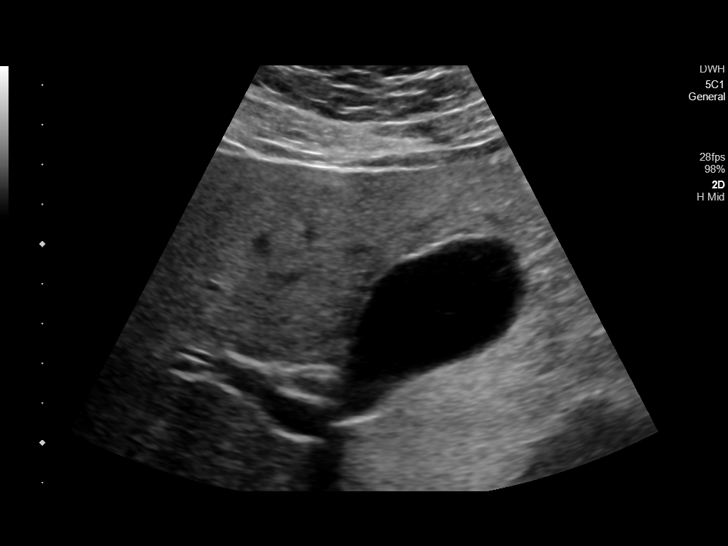
[im 21/45]
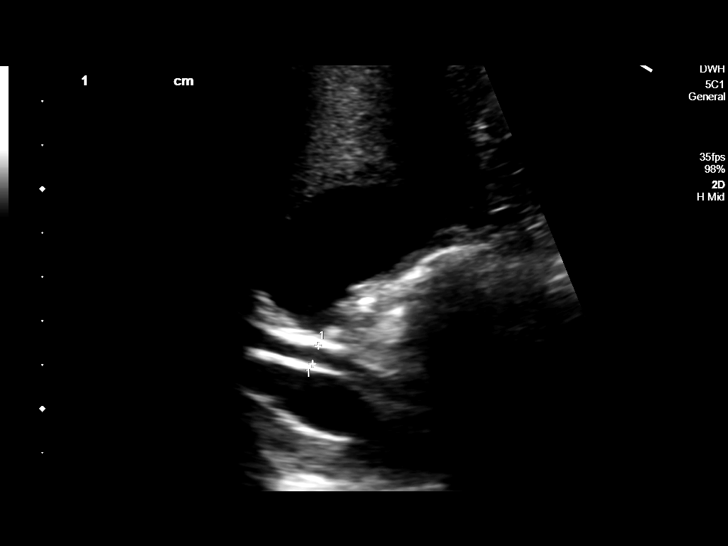
[im 24/45]
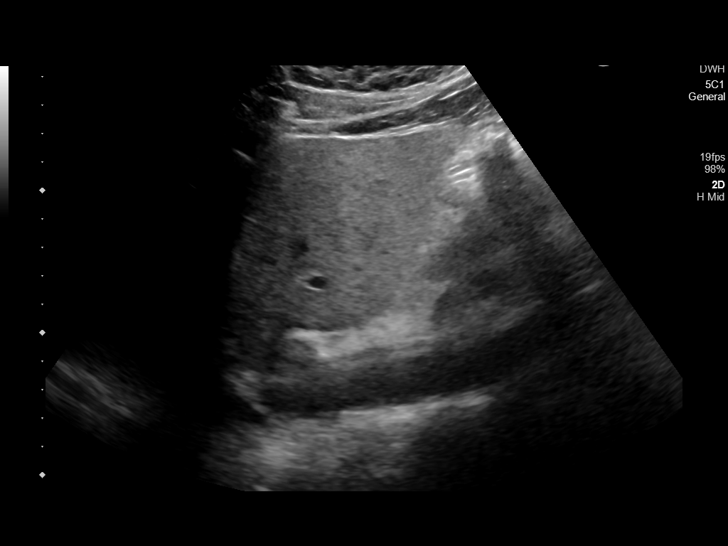
[im 28/45]
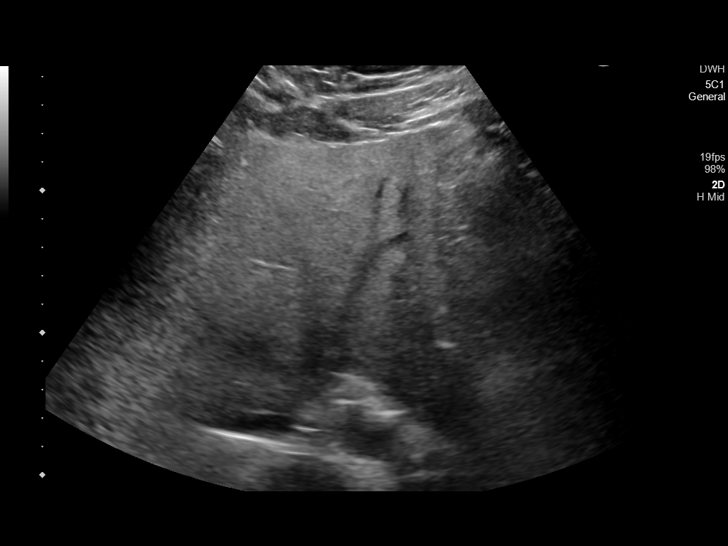
[im 30/45]
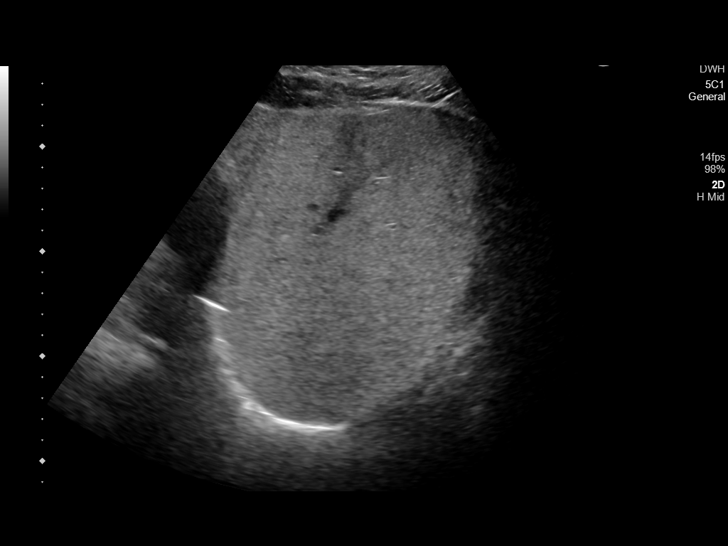
[im 34/45]
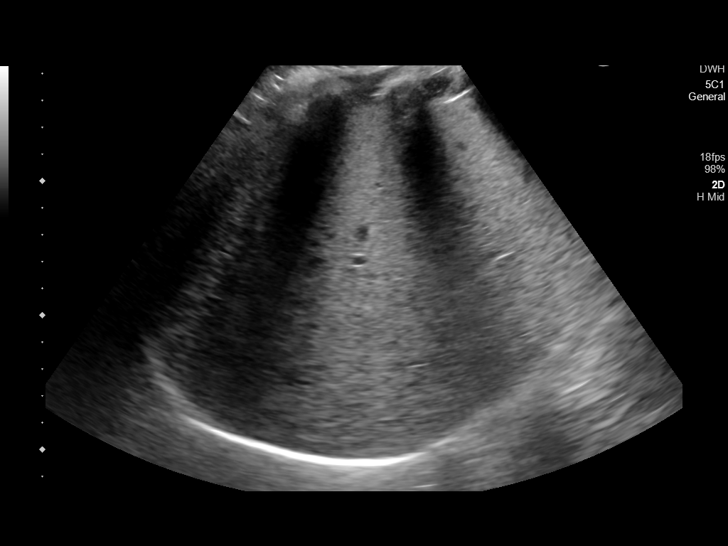
[im 37/45]
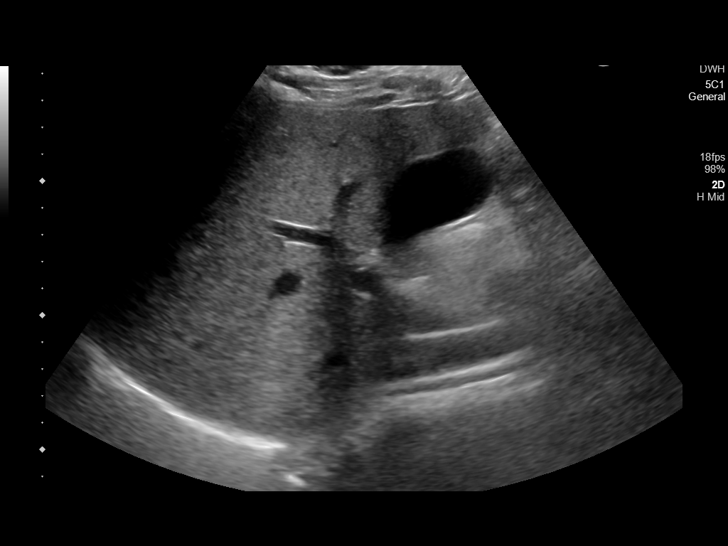
[im 41/45]
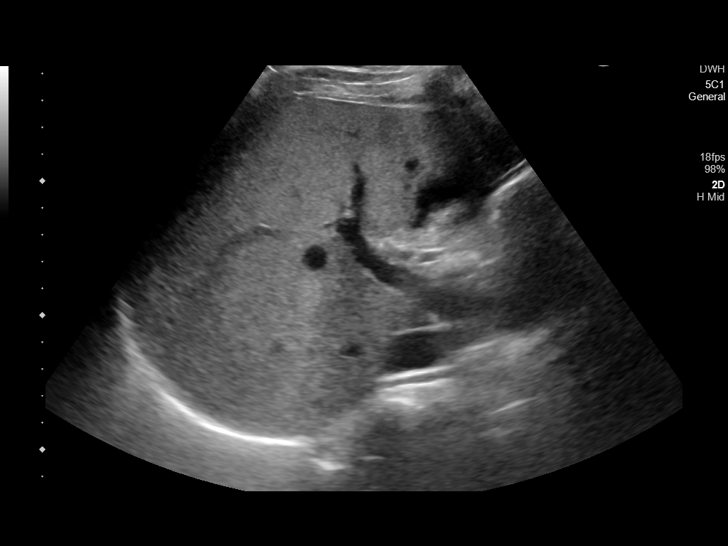
[im 45/45]
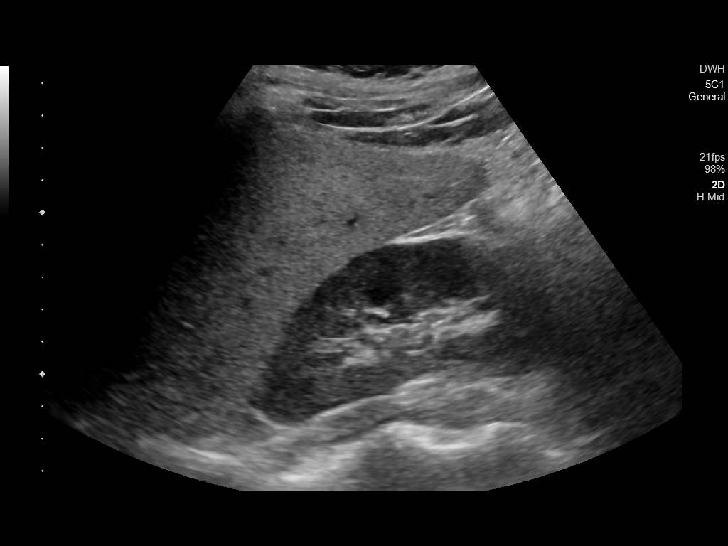

[14 of 25 positions shown; findings below may reference images not displayed]

FINDINGS: Gallbladder:

No gallstones or wall thickening visualized. No sonographic Murphy
sign noted by sonographer.

Common bile duct:

Diameter: 4 mm, normal

Liver:

No focal lesion identified. Within normal limits in parenchymal
echogenicity. Portal vein is patent on color Doppler imaging with
normal direction of blood flow towards the liver.

Other: None.
IMPRESSION: Normal examination. No evidence of cholelithiasis or acute
cholecystitis.

## 2023-06-19 IMAGING — CT CT ABD-PELV W/ CM
2 of 4 series · 15 of 46 positions shown, 17 images · IV contrast (APPLIED)
Comparison: CT abdomen pelvis dated 05/23/2014.

CLINICAL DATA: Abdominal pain.  Concern for acute pancreatitis.

EXAM:
CT ABDOMEN AND PELVIS WITH CONTRAST
TECHNIQUE: Multidetector CT imaging of the abdomen and pelvis was performed
using the standard protocol following bolus administration of
intravenous contrast.
CONTRAST:  80mL OMNIPAQUE IOHEXOL 350 MG/ML SOLN

[Series 2: routine abd/pel with · axial · 0.80mm/px · z∈[-456,-21]mm · 12 of 97 slices shown, 14 images]
[im 5/97  soft-tissue]
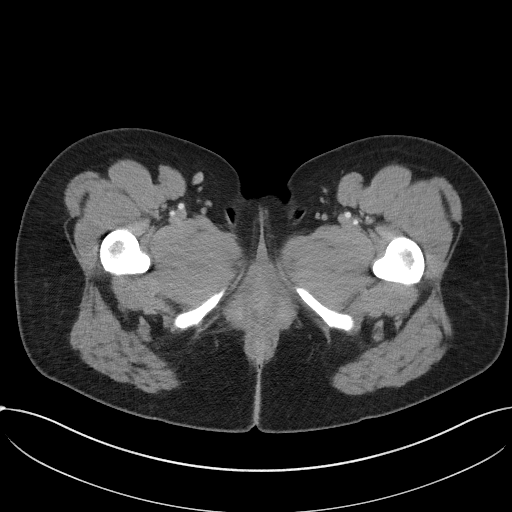
[im 5/97  bone]
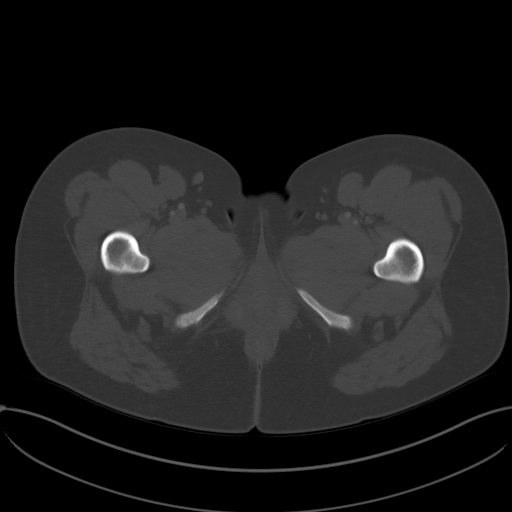
[im 14/97  soft-tissue]
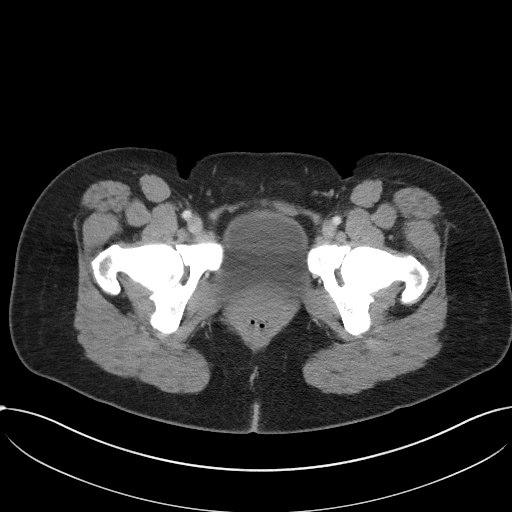
[im 23/97  soft-tissue]
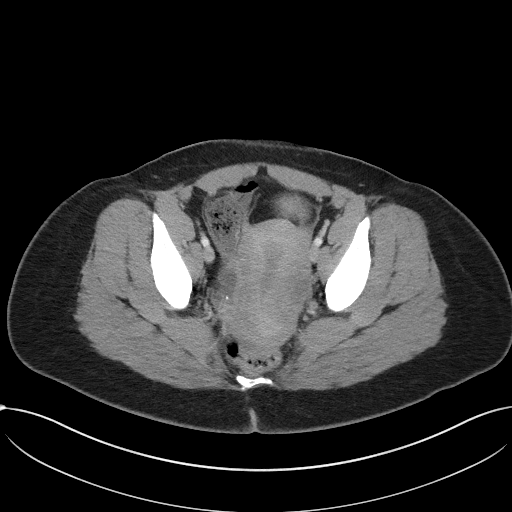
[im 28/97  soft-tissue]
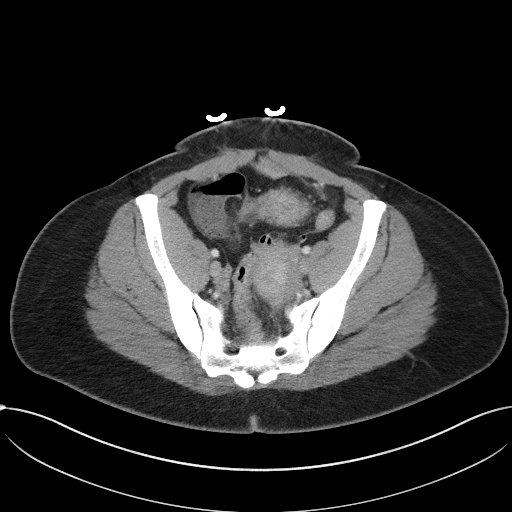
[im 37/97  soft-tissue]
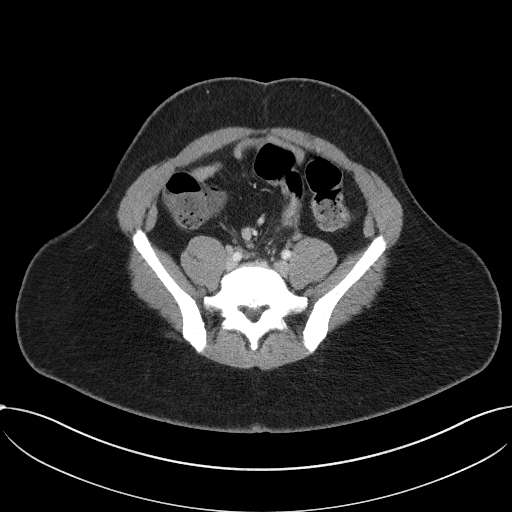
[im 46/97  soft-tissue]
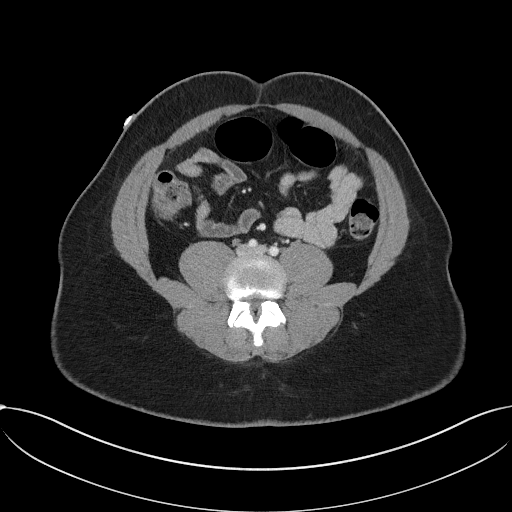
[im 51/97  soft-tissue]
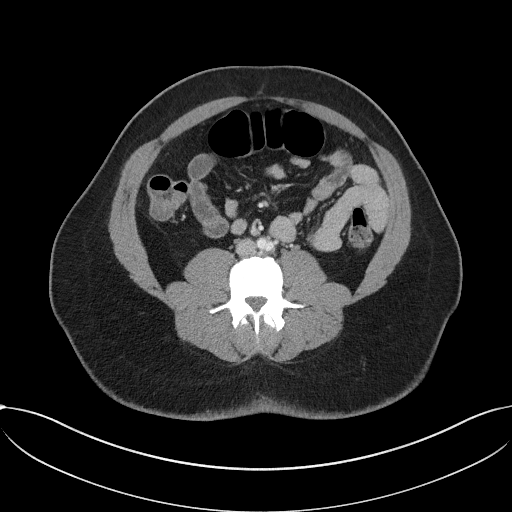
[im 60/97  soft-tissue]
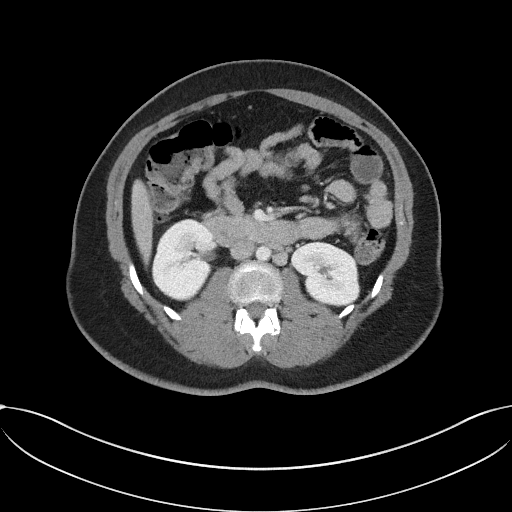
[im 69/97  soft-tissue]
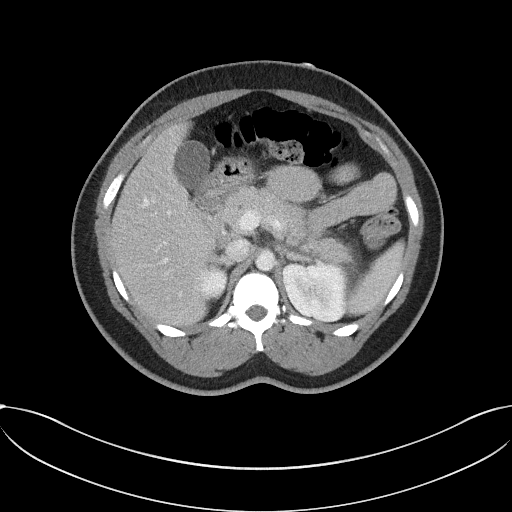
[im 69/97  bone]
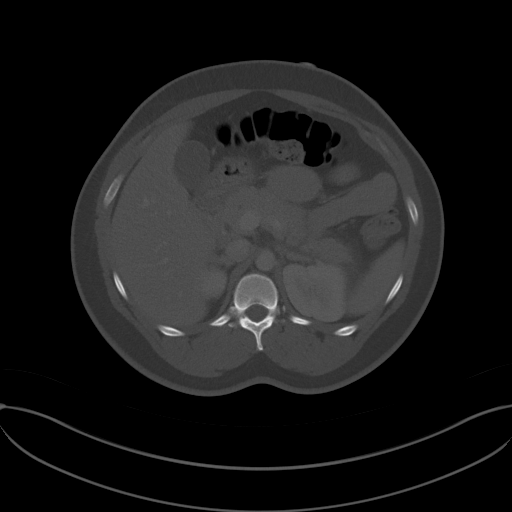
[im 74/97  soft-tissue]
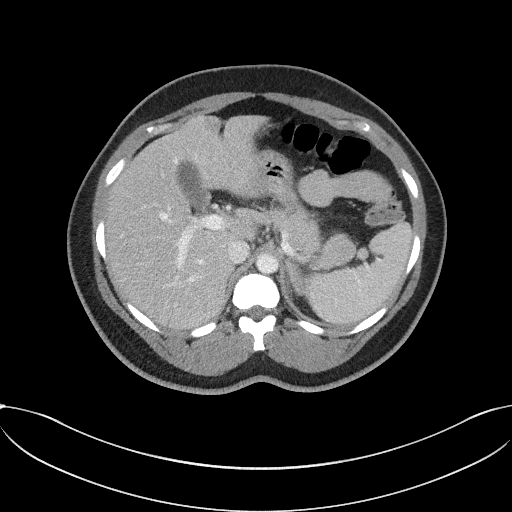
[im 83/97  soft-tissue]
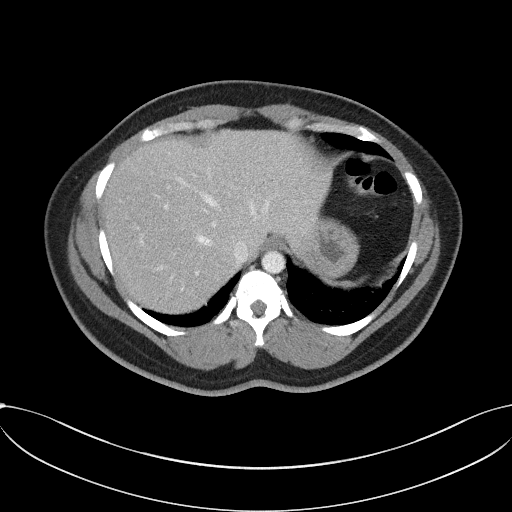
[im 92/97  soft-tissue]
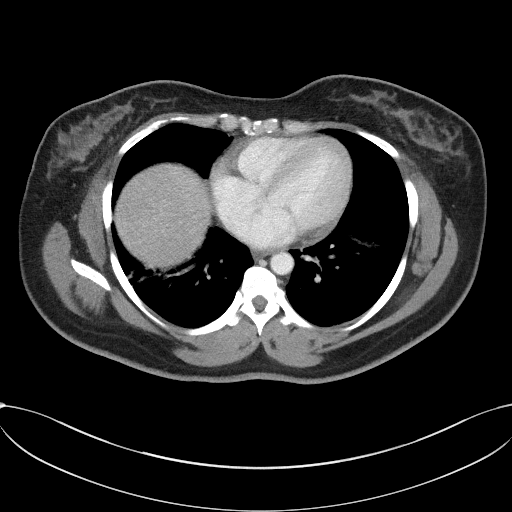

[Series 5: coronal st · coronal · 0.60mm/px · 3 of 95 slices shown]
[im 32/95  soft-tissue]
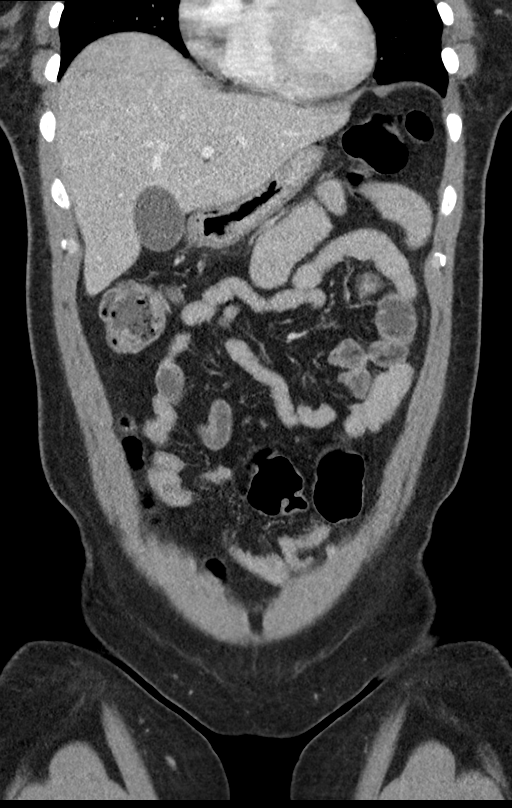
[im 42/95  soft-tissue]
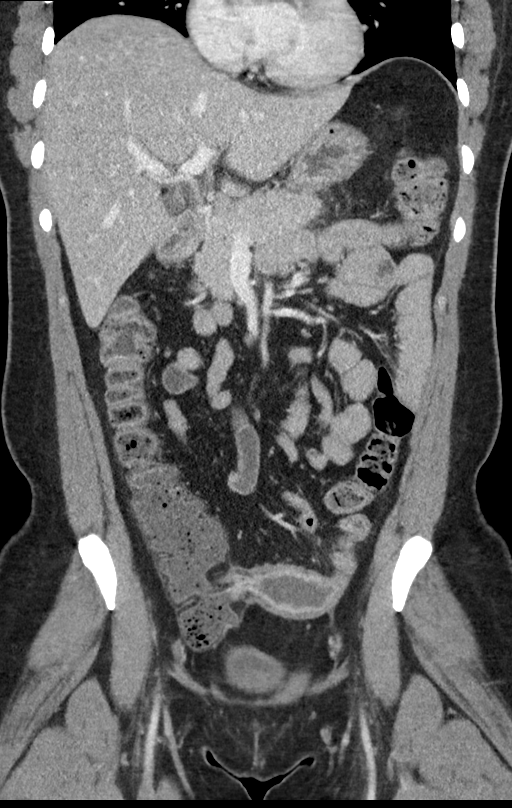
[im 53/95  soft-tissue]
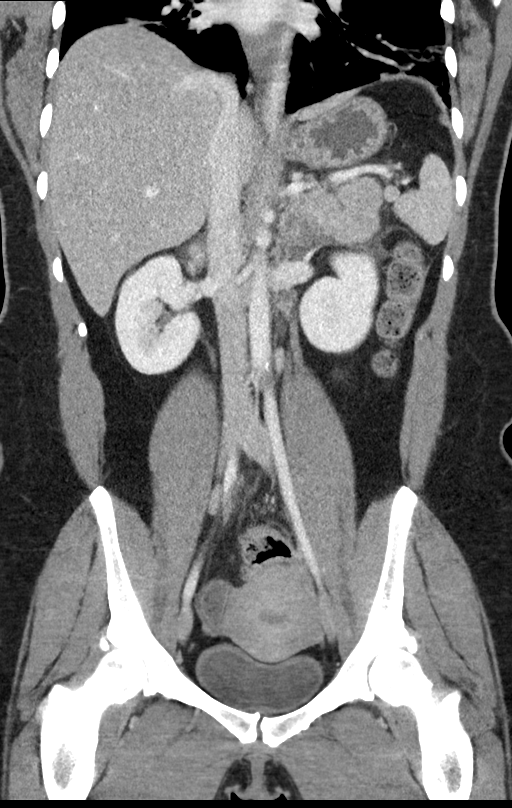

[15 of 46 positions shown; findings below may reference images not displayed]

FINDINGS: Lower chest: Bibasilar linear and streaky atelectasis/scarring.

No intra-abdominal free air or free fluid.

Hepatobiliary: No focal liver abnormality is seen. No gallstones,
gallbladder wall thickening, or biliary dilatation.

Pancreas: There is mild peripancreatic stranding and haziness
consistent with acute pancreatitis. No drainable fluid collection or
abscess

Spleen: Normal in size without focal abnormality.

Adrenals/Urinary Tract: The adrenal glands are unremarkable. The
kidneys, visualized ureters, and urinary bladder appear
unremarkable.

Stomach/Bowel: There is no bowel obstruction or active inflammation.
There is slight thickened appearance of the terminal ileum, possibly
sequela of prior inflammatory changes. Correlation with history of
Crohn's recommended. There is no bowel obstruction. The appendix is
normal.

Vascular/Lymphatic: The abdominal aorta and IVC are unremarkable. No
portal venous gas. There is no adenopathy.

Reproductive: The uterus is anteverted. Posterior body fibroid. No
adnexal masses. The left ovary is poorly visualized.

Other: None

Musculoskeletal: No acute or significant osseous findings.
IMPRESSION: 1. Acute pancreatitis. No drainable fluid collection or abscess.
2. No bowel obstruction. Normal appendix.

## 2023-09-10 DIAGNOSIS — H5213 Myopia, bilateral: Secondary | ICD-10-CM | POA: Diagnosis not present

## 2023-09-10 DIAGNOSIS — H40053 Ocular hypertension, bilateral: Secondary | ICD-10-CM | POA: Diagnosis not present

## 2023-09-10 DIAGNOSIS — H35033 Hypertensive retinopathy, bilateral: Secondary | ICD-10-CM | POA: Diagnosis not present

## 2023-09-10 DIAGNOSIS — E119 Type 2 diabetes mellitus without complications: Secondary | ICD-10-CM | POA: Diagnosis not present

## 2023-10-14 DIAGNOSIS — E119 Type 2 diabetes mellitus without complications: Secondary | ICD-10-CM | POA: Diagnosis not present

## 2023-10-14 DIAGNOSIS — Z0000 Encounter for general adult medical examination without abnormal findings: Secondary | ICD-10-CM | POA: Diagnosis not present

## 2023-10-14 DIAGNOSIS — E66811 Obesity, class 1: Secondary | ICD-10-CM | POA: Diagnosis not present

## 2023-10-14 DIAGNOSIS — F17210 Nicotine dependence, cigarettes, uncomplicated: Secondary | ICD-10-CM | POA: Diagnosis not present

## 2023-11-17 DIAGNOSIS — E785 Hyperlipidemia, unspecified: Secondary | ICD-10-CM | POA: Diagnosis not present

## 2023-11-17 DIAGNOSIS — E1169 Type 2 diabetes mellitus with other specified complication: Secondary | ICD-10-CM | POA: Diagnosis not present

## 2023-11-17 DIAGNOSIS — F17200 Nicotine dependence, unspecified, uncomplicated: Secondary | ICD-10-CM | POA: Diagnosis not present

## 2023-12-16 DIAGNOSIS — E1169 Type 2 diabetes mellitus with other specified complication: Secondary | ICD-10-CM | POA: Diagnosis not present

## 2023-12-31 DIAGNOSIS — E1169 Type 2 diabetes mellitus with other specified complication: Secondary | ICD-10-CM | POA: Diagnosis not present

## 2024-01-12 DIAGNOSIS — F17200 Nicotine dependence, unspecified, uncomplicated: Secondary | ICD-10-CM | POA: Diagnosis not present

## 2024-01-12 DIAGNOSIS — E1169 Type 2 diabetes mellitus with other specified complication: Secondary | ICD-10-CM | POA: Diagnosis not present

## 2024-01-12 DIAGNOSIS — E785 Hyperlipidemia, unspecified: Secondary | ICD-10-CM | POA: Diagnosis not present

## 2024-02-04 DIAGNOSIS — N644 Mastodynia: Secondary | ICD-10-CM | POA: Diagnosis not present

## 2024-02-04 DIAGNOSIS — E1169 Type 2 diabetes mellitus with other specified complication: Secondary | ICD-10-CM | POA: Diagnosis not present

## 2024-02-17 ENCOUNTER — Other Ambulatory Visit: Payer: Self-pay | Admitting: Family Medicine

## 2024-02-17 DIAGNOSIS — N644 Mastodynia: Secondary | ICD-10-CM

## 2024-02-26 DIAGNOSIS — M25571 Pain in right ankle and joints of right foot: Secondary | ICD-10-CM | POA: Diagnosis not present

## 2024-03-16 ENCOUNTER — Ambulatory Visit
Admission: RE | Admit: 2024-03-16 | Discharge: 2024-03-16 | Disposition: A | Discharge status: Home / Self Care | Source: Ambulatory Visit | Attending: Family Medicine

## 2024-03-16 ENCOUNTER — Ambulatory Visit
Admission: RE | Admit: 2024-03-16 | Discharge: 2024-03-16 | Disposition: A | Discharge status: Home / Self Care | Source: Ambulatory Visit | Attending: Family Medicine | Admitting: Family Medicine

## 2024-03-16 DIAGNOSIS — N644 Mastodynia: Secondary | ICD-10-CM

## 2024-04-12 DIAGNOSIS — E785 Hyperlipidemia, unspecified: Secondary | ICD-10-CM | POA: Diagnosis not present

## 2024-04-12 DIAGNOSIS — F17200 Nicotine dependence, unspecified, uncomplicated: Secondary | ICD-10-CM | POA: Diagnosis not present

## 2024-04-12 DIAGNOSIS — E1169 Type 2 diabetes mellitus with other specified complication: Secondary | ICD-10-CM | POA: Diagnosis not present

## 2024-05-24 DIAGNOSIS — E1169 Type 2 diabetes mellitus with other specified complication: Secondary | ICD-10-CM | POA: Diagnosis not present

## 2024-05-24 DIAGNOSIS — E785 Hyperlipidemia, unspecified: Secondary | ICD-10-CM | POA: Diagnosis not present

## 2024-05-24 DIAGNOSIS — F17200 Nicotine dependence, unspecified, uncomplicated: Secondary | ICD-10-CM | POA: Diagnosis not present

## 2024-07-14 DIAGNOSIS — R1030 Lower abdominal pain, unspecified: Secondary | ICD-10-CM | POA: Diagnosis not present

## 2024-07-14 DIAGNOSIS — R300 Dysuria: Secondary | ICD-10-CM | POA: Diagnosis not present

## 2024-07-14 DIAGNOSIS — F17200 Nicotine dependence, unspecified, uncomplicated: Secondary | ICD-10-CM | POA: Diagnosis not present

## 2024-07-14 DIAGNOSIS — E785 Hyperlipidemia, unspecified: Secondary | ICD-10-CM | POA: Diagnosis not present

## 2024-07-14 DIAGNOSIS — E1165 Type 2 diabetes mellitus with hyperglycemia: Secondary | ICD-10-CM | POA: Diagnosis not present

## 2024-09-15 DIAGNOSIS — E1165 Type 2 diabetes mellitus with hyperglycemia: Secondary | ICD-10-CM | POA: Diagnosis not present

## 2024-09-15 DIAGNOSIS — H40053 Ocular hypertension, bilateral: Secondary | ICD-10-CM | POA: Diagnosis not present

## 2024-09-15 DIAGNOSIS — H5213 Myopia, bilateral: Secondary | ICD-10-CM | POA: Diagnosis not present

## 2024-09-15 DIAGNOSIS — H35033 Hypertensive retinopathy, bilateral: Secondary | ICD-10-CM | POA: Diagnosis not present
